# Patient Record
Sex: Male | Born: 1960 | Race: White | Hispanic: No | Marital: Married | Smoking: Light tobacco smoker
Health system: Southern US, Community
[De-identification: ages and names within clinical notes are randomized; demographics above are authoritative.]

## PROBLEM LIST (undated history)

## (undated) DIAGNOSIS — E785 Hyperlipidemia, unspecified: Secondary | ICD-10-CM

## (undated) DIAGNOSIS — Z460 Encounter for fitting and adjustment of spectacles and contact lenses: Secondary | ICD-10-CM

## (undated) DIAGNOSIS — IMO0002 Reserved for concepts with insufficient information to code with codable children: Secondary | ICD-10-CM

## (undated) DIAGNOSIS — T7840XA Allergy, unspecified, initial encounter: Secondary | ICD-10-CM

## (undated) DIAGNOSIS — I10 Essential (primary) hypertension: Secondary | ICD-10-CM

## (undated) HISTORY — PX: POLYPECTOMY: SHX149

## (undated) HISTORY — DX: Essential (primary) hypertension: I10

## (undated) HISTORY — PX: BACK SURGERY: SHX140

## (undated) HISTORY — DX: Hyperlipidemia, unspecified: E78.5

## (undated) HISTORY — PX: COLONOSCOPY: SHX174

## (undated) HISTORY — DX: Allergy, unspecified, initial encounter: T78.40XA

---
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
## 1999-03-10 ENCOUNTER — Ambulatory Visit (HOSPITAL_COMMUNITY): Admission: RE | Admit: 1999-03-10 | Discharge: 1999-03-10 | Payer: Self-pay | Admitting: Neurosurgery

## 1999-03-10 ENCOUNTER — Encounter: Payer: Self-pay | Admitting: Neurosurgery

## 1999-03-24 ENCOUNTER — Encounter: Payer: Self-pay | Admitting: Neurosurgery

## 1999-03-24 ENCOUNTER — Ambulatory Visit (HOSPITAL_COMMUNITY): Admission: RE | Admit: 1999-03-24 | Discharge: 1999-03-24 | Payer: Self-pay | Admitting: Neurosurgery

## 1999-04-07 ENCOUNTER — Encounter: Payer: Self-pay | Admitting: Neurosurgery

## 1999-04-07 ENCOUNTER — Ambulatory Visit (HOSPITAL_COMMUNITY): Admission: RE | Admit: 1999-04-07 | Discharge: 1999-04-07 | Payer: Self-pay | Admitting: Neurosurgery

## 2001-11-06 ENCOUNTER — Encounter: Admission: RE | Admit: 2001-11-06 | Discharge: 2001-11-06 | Payer: Self-pay | Admitting: Neurosurgery

## 2001-11-06 ENCOUNTER — Encounter: Payer: Self-pay | Admitting: Neurosurgery

## 2001-11-20 ENCOUNTER — Encounter: Admission: RE | Admit: 2001-11-20 | Discharge: 2001-11-20 | Payer: Self-pay | Admitting: Neurosurgery

## 2001-11-20 ENCOUNTER — Encounter: Payer: Self-pay | Admitting: Neurosurgery

## 2002-05-04 ENCOUNTER — Encounter: Payer: Self-pay | Admitting: Family Medicine

## 2002-05-04 ENCOUNTER — Encounter: Admission: RE | Admit: 2002-05-04 | Discharge: 2002-05-04 | Payer: Self-pay | Admitting: Family Medicine

## 2002-05-18 ENCOUNTER — Encounter: Admission: RE | Admit: 2002-05-18 | Discharge: 2002-05-18 | Payer: Self-pay | Admitting: Family Medicine

## 2002-05-18 ENCOUNTER — Encounter: Payer: Self-pay | Admitting: Family Medicine

## 2002-06-02 ENCOUNTER — Encounter: Admission: RE | Admit: 2002-06-02 | Discharge: 2002-06-02 | Payer: Self-pay | Admitting: Family Medicine

## 2002-06-02 ENCOUNTER — Encounter: Payer: Self-pay | Admitting: Family Medicine

## 2002-07-24 ENCOUNTER — Ambulatory Visit (HOSPITAL_COMMUNITY): Admission: RE | Admit: 2002-07-24 | Discharge: 2002-07-25 | Payer: Self-pay | Admitting: Neurosurgery

## 2002-07-24 ENCOUNTER — Encounter: Payer: Self-pay | Admitting: Neurosurgery

## 2009-03-11 ENCOUNTER — Encounter: Admission: RE | Admit: 2009-03-11 | Discharge: 2009-03-11 | Payer: Self-pay | Admitting: Internal Medicine

## 2009-03-16 ENCOUNTER — Ambulatory Visit (HOSPITAL_COMMUNITY): Admission: RE | Admit: 2009-03-16 | Discharge: 2009-03-17 | Payer: Self-pay | Admitting: Neurosurgery

## 2009-03-23 ENCOUNTER — Encounter: Admission: RE | Admit: 2009-03-23 | Discharge: 2009-03-23 | Payer: Self-pay | Admitting: Neurosurgery

## 2010-10-04 IMAGING — CR DG LUMBAR SPINE 1V
1 series · 1 of 1 positions shown · non-contrast
Comparison: Lumbar spine MRI 03/11/2009

CLINICAL DATA: Lumbar disc herniation.

LUMBAR SPINE - 1 VIEW

[view not recorded]
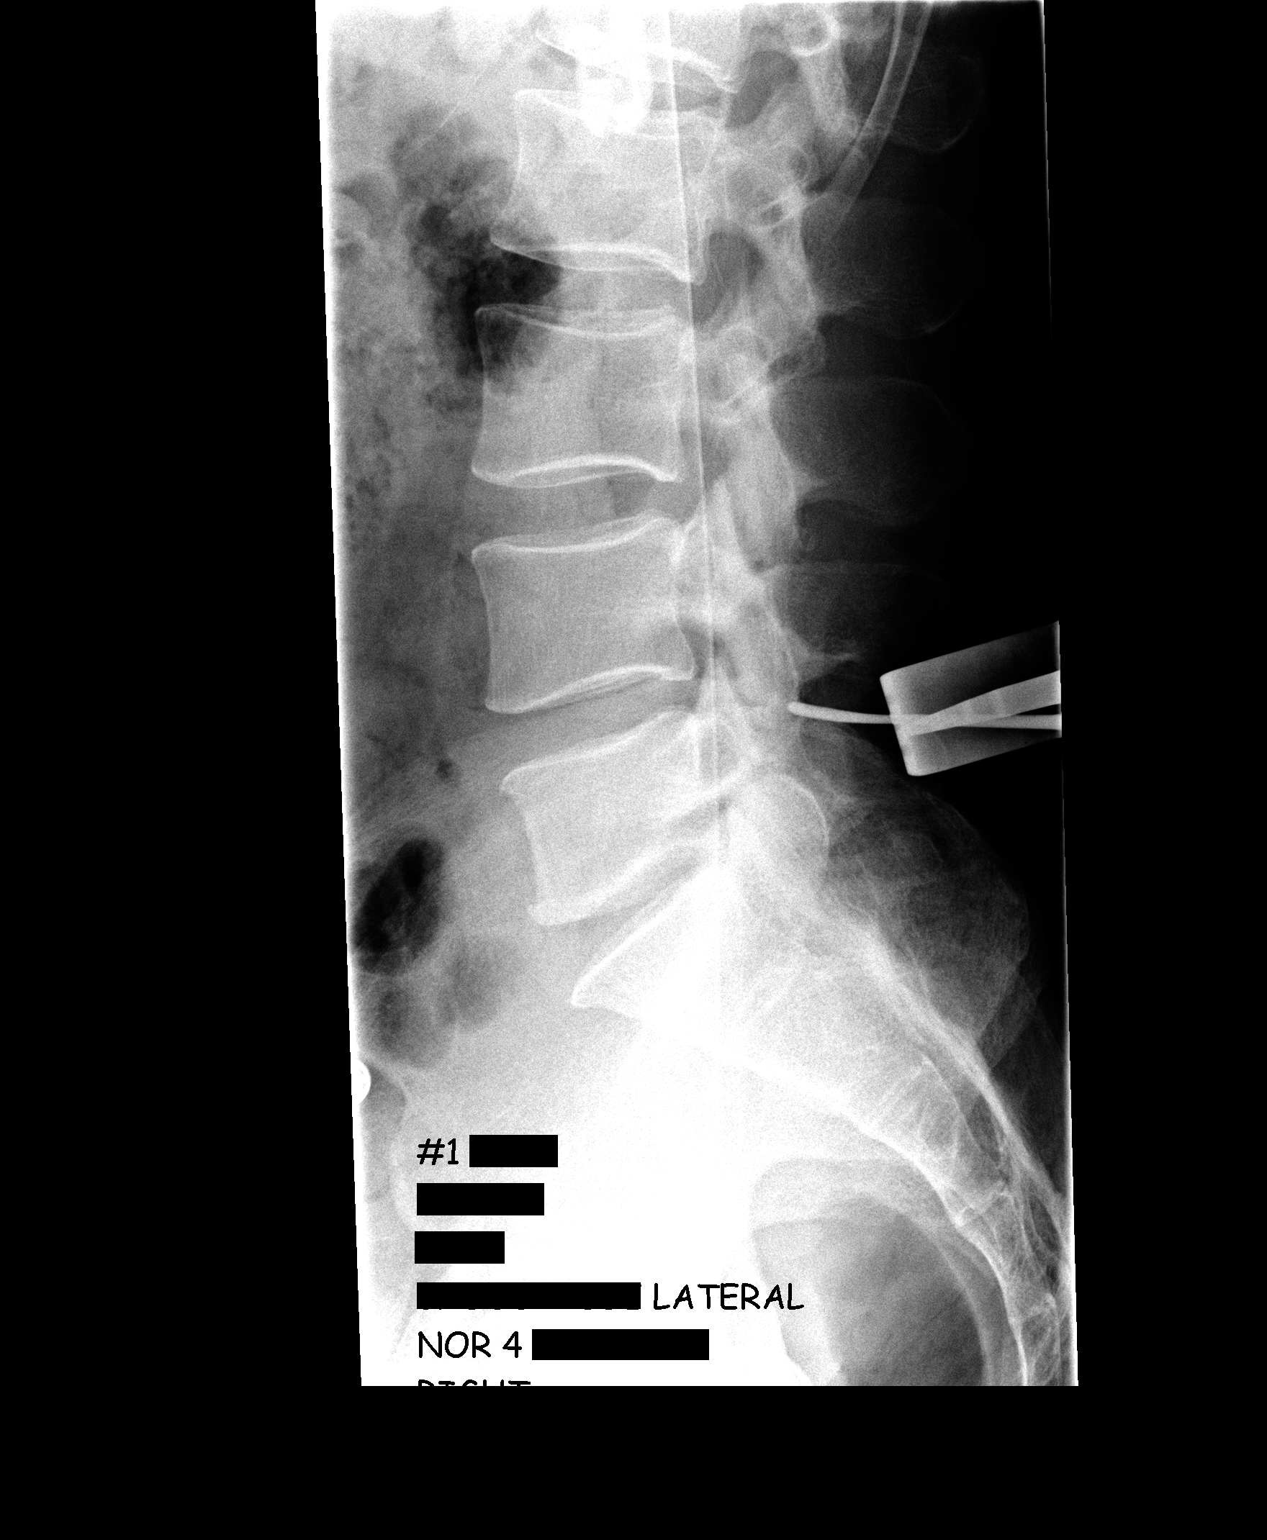

[1 of 1 positions shown; findings below may reference images not displayed]

FINDINGS: Lateral lumbar spine film from the operating room
demonstrates a surgical instrument marking the L4-5 disc space.
IMPRESSION: L4-5 marked intraoperatively.

## 2010-10-04 IMAGING — CR DG CHEST 2V
2 series · 2 of 2 positions shown · non-contrast
Comparison: None

CLINICAL DATA: Pre admit for lumbar disc herniation

CHEST - 2 VIEW

[view not recorded (1 of 2)]
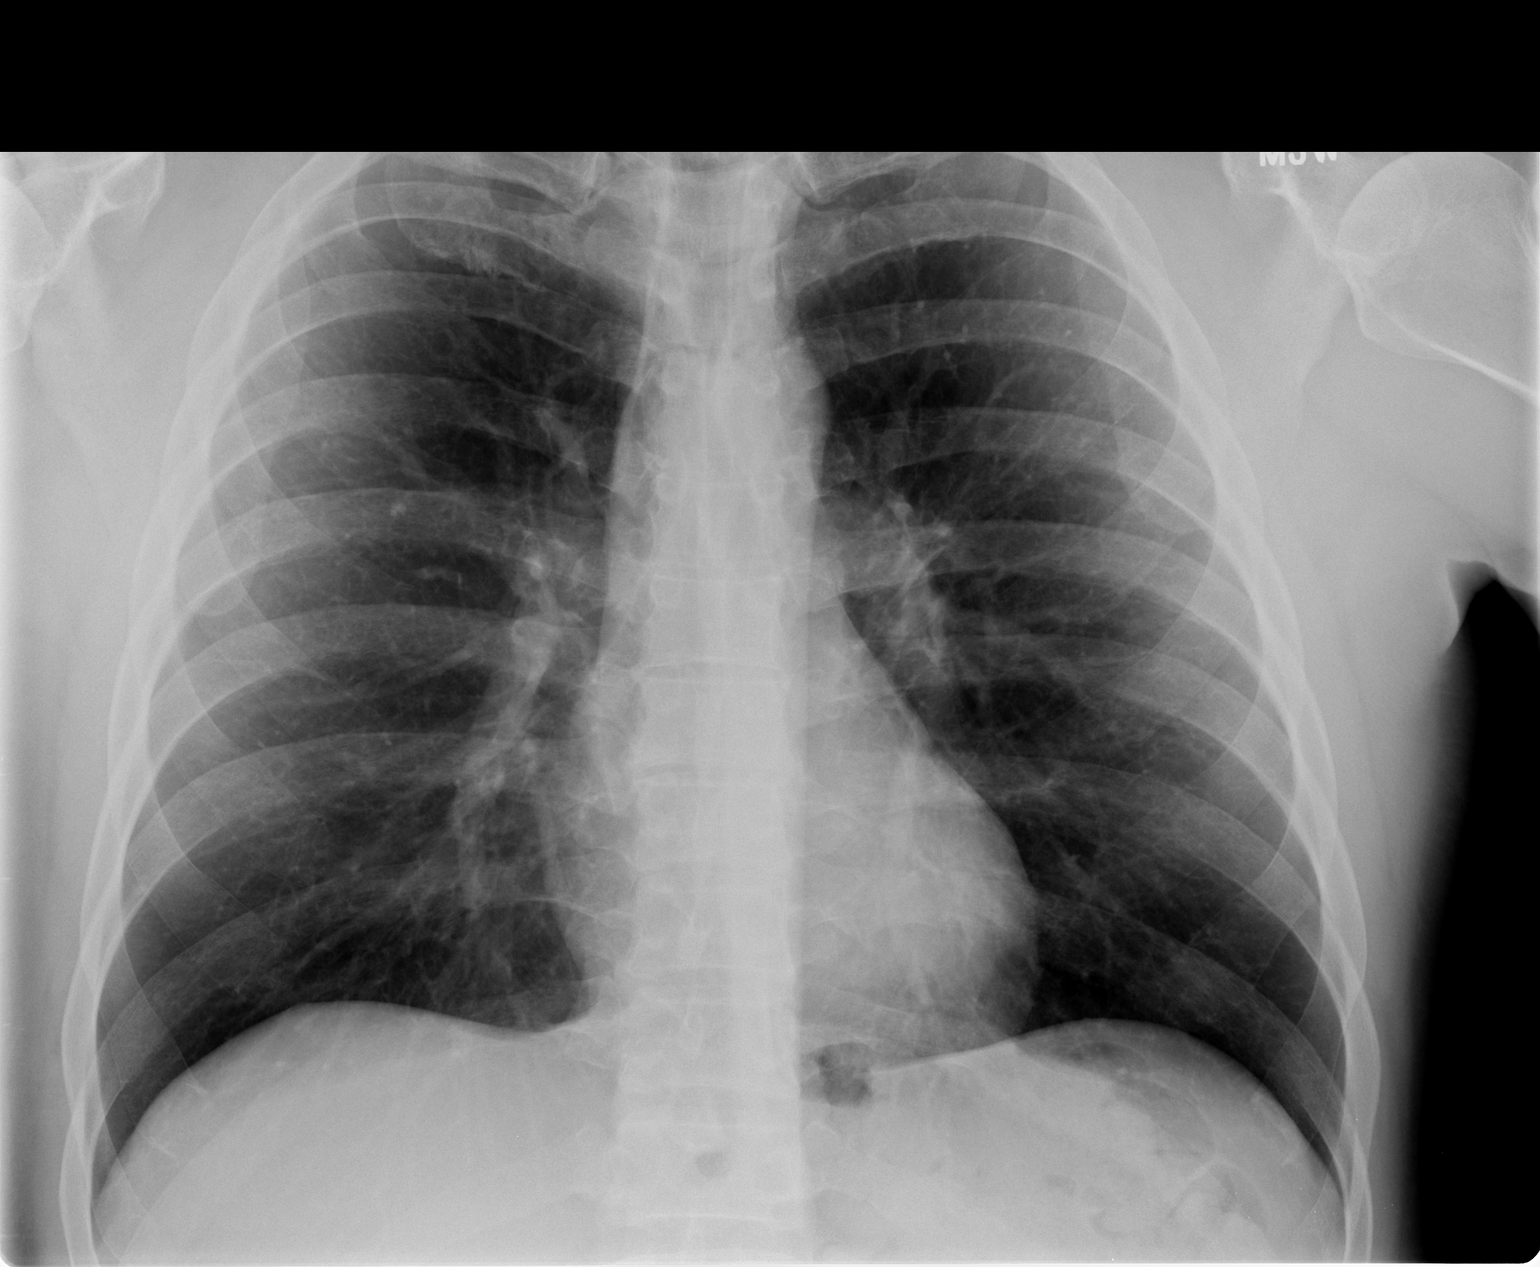

[view not recorded (2 of 2)]
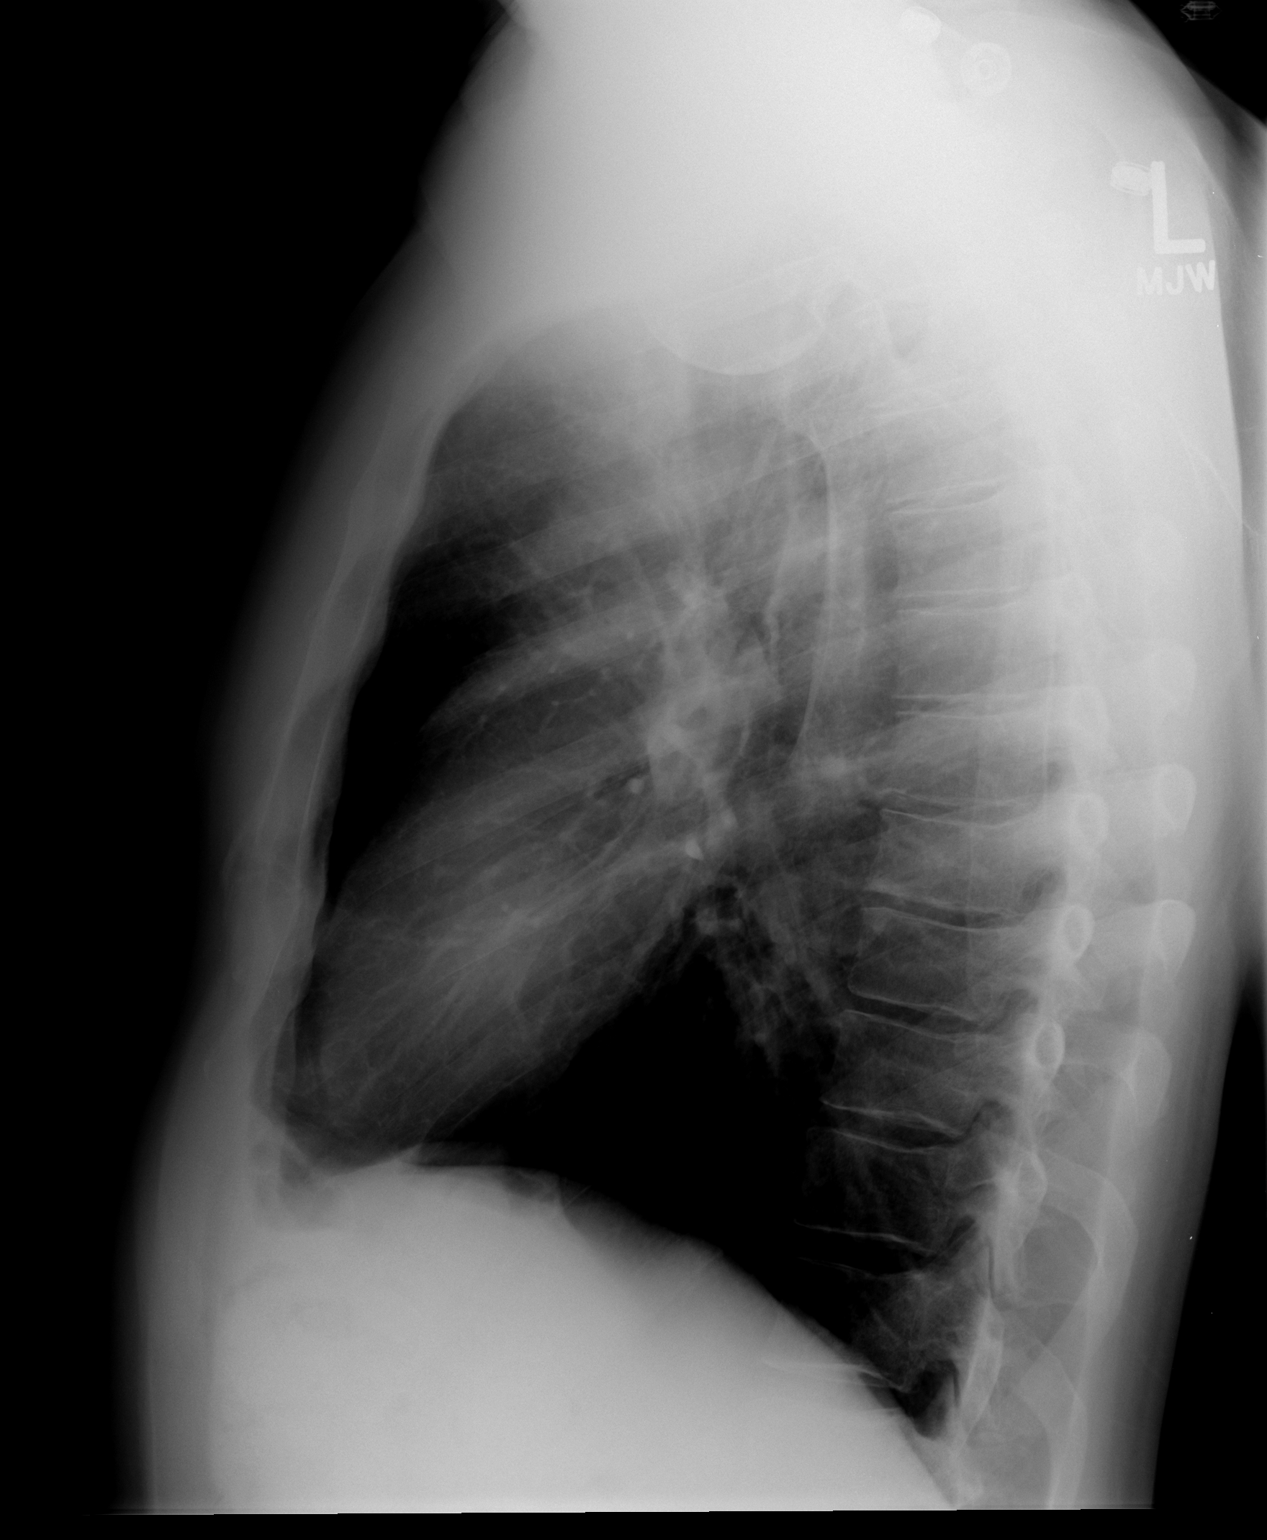

[2 of 2 positions shown; findings below may reference images not displayed]

FINDINGS: Heart and mediastinal contours normal.  There is a small
calcified granuloma in the right upper lobe.  The lungs are mildly
hyperaerated.  No active disease.  Osseous structures intact.
IMPRESSION: Chronic changes as above - no active disease.

## 2010-11-04 LAB — BASIC METABOLIC PANEL
BUN: 14 mg/dL (ref 6–23)
CO2: 30 mEq/L (ref 19–32)
Calcium: 9.2 mg/dL (ref 8.4–10.5)
Chloride: 103 mEq/L (ref 96–112)
Creatinine, Ser: 1.01 mg/dL (ref 0.4–1.5)
GFR calc Af Amer: >60 mL/min (ref >60)
GFR calc non Af Amer: >60 mL/min (ref >60)
Glucose, Bld: 106 mg/dL — ABNORMAL HIGH (ref 70–99)
Potassium: 4.2 mEq/L (ref 3.5–5.1)
Sodium: 139 mEq/L (ref 135–145)

## 2010-11-04 LAB — CBC
HCT: 44.6 % (ref 39.0–52.0)
Hemoglobin: 15.4 g/dL (ref 13.0–17.0)
MCHC: 34.5 g/dL (ref 30.0–36.0)
MCV: 95 fL (ref 78.0–100.0)
Platelets: 211 10*3/uL (ref 150–400)
RBC: 4.69 MIL/uL (ref 4.22–5.81)
RDW: 12.5 % (ref 11.5–15.5)
WBC: 6.1 10*3/uL (ref 4.0–10.5)

## 2010-12-12 NOTE — Op Note (Signed)
NAMEANGELL, HONSE                ACCOUNT NO.:  [id number]   MEDICAL RECORD NO.:  [id number]          PATIENT TYPE:  OIB   LOCATION:  3528                         FACILITY:  [facility]   PHYSICIAN:  Hewitt Shorts, M.D.DATE OF BIRTH:  [birth year]   DATE OF PROCEDURE:  03/16/2009  DATE OF DISCHARGE:                               OPERATIVE REPORT   PREOPERATIVE DIAGNOSES:  1. Left L5-S1 recurrent lumbar disk herniation.  2. Lumbar degenerative disk disease.  3. Lumbar spondylosis.  4. Lumbar radiculopathy.   POSTOPERATIVE DIAGNOSES:  1. Left L5-S1 recurrent lumbar disk herniation.  2. Lumbar degenerative disk disease.  3. Lumbar spondylosis.  4. Lumbar radiculopathy.   PROCEDURE:  Left L5-S1 lumbar laminotomy and microdiskectomy with  microdissection.   SURGEON:  Hewitt Shorts, MD.   ASSISTANT:  Dr. Lovell Sheehan.   ANESTHESIA:  General endotracheal.   INDICATIONS:  The patient is a 50 year old man who had a lumbar  diskectomy done about 8 years ago.  He did well up until about a month  ago when he developed recurrent pain.  He was found to have a large  recurrent disk herniation and a decision was made to proceed with  laminotomy and microdiskectomy.   PROCEDURE:  The patient was brought to the operating room and placed  under general endotracheal anesthesia.  The patient was turned to a  prone position.  Lumbar region was prepped with Betadine soap and  solution and draped in a sterile fashion.  The midline was infiltrated  with local anesthetic with epinephrine and an incision was made through  the previous midline incision and carried down through the subcutaneous  tissue.  Bipolar cautery and electrocautery was used to maintain  hemostasis.  Dissection was carried down to the lumbar fascia that was  incised on the left side of the midline.  The paraspinal muscles were  dissected from the spinous process and lamina in a subperiosteal  fashion.  An x-ray was taken and  then we identified the L5-S1  interlaminar space.  The operative microscope was draped and brought to  the field to provide additional navigation, illumination, and  visualization.  The remainder of the decompression was performed using  microdissection and microsurgical technique.  Care was taken in  dissecting and defining the margins of the previous laminotomy, and then  we extended the laminotomy rostrally and laterally until we had good  ligamentum flavum exposed.  We were able to dissect back towards the  previous exposure.  We were able to gradually mobilize the thecal sac  and nerve root.  Ligamentum flavum was carefully removed and scar tissue  was separated from the dura.  As this was done, we were able to mobilize  the fecal sac.  Immediately, we encountered  the free fragment of disk  herniation.  This was removed in several large fragments and we were  able to get good decompression of the thecal sac and nerve root.  We  then further dissected the epidural scar tissue and we were able to  mobilize the thecal sac and nerve root medially.  We  exposed the annulus  of the L5-S1 disk.  Overlying epidural veins were coagulated and divided  and then we examined the epidural space.  No further fragments were  found and the rent in the annulus through which a disk herniation had  occurred, could not be found and appears to have sealed back over.  Therefore, it was felt that we achieved good decompression of the nerve  root and thecal sac by removing the free fragment and that we did not  need to enter into the disk space.   We then irrigated the wound with Bacitracin solution.  Checked for  hemostasis, was established with use of bipolar cautery and then  instilled 2 mL of fentanyl and 80 mg of Depo-Medrol into the epidural  space and proceeded with closure.  The deep fascia was closed with  interrupted undyed #1 Vicryl sutures.  Scarpa fascia was closed with  interrupted undyed #1  Vicryl sutures.  The subcutaneous and subcuticular  were closed with interrupted inverted 2-0 and 3-0 undyed Vicryl sutures.  The skin was reapproximated with Dermabond.  The procedure was tolerated  well.  The estimated blood loss was 25 mL.  Sponge and needle counts  were correct.  Following surgery, the patient was returned back to the  supine position, to be reversed from the anesthetic, extubated, and  transferred to the recovery room for further care.      Hewitt Shorts, M.D.  Electronically Signed     RWN/MEDQ  D:  03/16/2009  T:  03/17/2009  Job:  [id number]

## 2010-12-15 NOTE — Op Note (Signed)
NAMESAED, HUDLOW                          ACCOUNT NO.:  [id number]   MEDICAL RECORD NO.:  [id number]                   PATIENT TYPE:  OIB   LOCATION:  3012                                 FACILITY:  [facility]   PHYSICIAN:  Coletta Memos, M.D.                  DATE OF BIRTH:  31-Oct-1960   DATE OF PROCEDURE:  07/24/2002  DATE OF DISCHARGE:                                 OPERATIVE REPORT   PREOPERATIVE DIAGNOSES:  1. Displaced disk at L5-S1 left.  2. Left S1 radiculopathy.   POSTOPERATIVE DIAGNOSES:  1. Displaced disk at L5-S1 left.  2. Lumbar radiculopathy.  3. Degenerative disk disease at L5-S1.   PROCEDURE:  Left L5-S1 diskectomy with microdissection.   COMPLICATIONS:  None.   SURGEON:  Coletta Memos, M.D.   ANESTHESIA:  General endotracheal.   INDICATIONS:  The patient is a patient whom I have seen since 2000.  I have  followed him for degenerative disk and displaced disk at L5-S1 during that  time period.  Epidural steroids have been able to deal with his pain until  recently.  He therefore was admitted for a diskectomy at L5-S1.   DESCRIPTION OF PROCEDURE:  The patient was brought to the operating room,  intubated, and placed under general anesthesia without difficulty.  He was  rolled prone onto a Wilson frame and all pressure points were properly  padded.  His back was prepped.  He was draped in a sterile fashion.  I  infiltrated 0.5% lidocaine and 1:200,000 strength epinephrine at the  proposed incision site after taking a localizing x-ray.  I then opened the  skin with a #10 blade and took this down to the thoracolumbar fascia.  I  exposed the laminae of L5 and S1 in a subperiosteal fashion.  I then removed  the ligamentum flavum between L5 and S1, exposing the thecal sac.  The  microscope was then brought into the operative field and with  microdissection, I then retracted the thecal sac and S1 nerve root medially  and performed a diskectomy after opening the  disk space with a #15 blade.  The disk was removed rather easily, as it is quite degenerated.  I used  Epstein curettes, pituitary rongeurs, and Kerrison punches to remove disk  material.  After I felt that adequate decompression had been performed, I  inspected the nerve root and did not appreciate any more disk material.  I  then irrigated the wound.  I then closed the wound in a layered fashion  using Vicryl sutures, reapproximating the thoracolumbar fascia, subcutaneous  tissues in succession.  Dermabond was used for a sterile dressing.  The  patient tolerated the procedure well without difficulty.  She was rolled  supine, extubated, and was moving all extremities.  Coletta Memos, M.D.   KC/MEDQ  D:  07/25/2002  T:  07/25/2002  Job:  [id number]

## 2012-01-24 ENCOUNTER — Encounter: Payer: Self-pay | Admitting: Gastroenterology

## 2012-02-25 ENCOUNTER — Encounter: Payer: Self-pay | Admitting: Gastroenterology

## 2012-02-25 ENCOUNTER — Ambulatory Visit (AMBULATORY_SURGERY_CENTER): Payer: Managed Care, Other (non HMO) | Admitting: *Deleted

## 2012-02-25 VITALS — Ht 71.0 in | Wt 214.8 lb

## 2012-02-25 DIAGNOSIS — Z1211 Encounter for screening for malignant neoplasm of colon: Secondary | ICD-10-CM

## 2012-02-25 MED ORDER — MOVIPREP 100 G PO SOLR: 1.0000 | Freq: Once | ORAL | Status: DC

## 2012-03-10 ENCOUNTER — Encounter: Payer: Self-pay | Admitting: Gastroenterology

## 2012-03-17 ENCOUNTER — Encounter: Payer: Self-pay | Admitting: Gastroenterology

## 2012-03-17 ENCOUNTER — Ambulatory Visit (AMBULATORY_SURGERY_CENTER): Payer: Managed Care, Other (non HMO) | Admitting: Gastroenterology

## 2012-03-17 VITALS — BP 111/78 | HR 62 | Temp 98.2°F | Resp 20 | Ht 71.0 in | Wt 214.0 lb

## 2012-03-17 DIAGNOSIS — D126 Benign neoplasm of colon, unspecified: Secondary | ICD-10-CM

## 2012-03-17 DIAGNOSIS — Z1211 Encounter for screening for malignant neoplasm of colon: Secondary | ICD-10-CM

## 2012-03-17 MED ORDER — SODIUM CHLORIDE 0.9 % IV SOLN: 500.0000 mL | INTRAVENOUS | Status: DC

## 2012-03-17 NOTE — Patient Instructions (Signed)
YOU HAD AN ENDOSCOPIC PROCEDURE TODAY AT THE [facility]: Refer to the procedure report that was given to you for any specific questions about what was found during the examination.  If the procedure report does not answer your questions, please call your gastroenterologist to clarify.  If you requested that your care partner not be given the details of your procedure findings, then the procedure report has been included in a sealed envelope for you to review at your convenience later.  YOU SHOULD EXPECT: Some feelings of bloating in the abdomen. Passage of more gas than usual.  Walking can help get rid of the air that was put into your GI tract during the procedure and reduce the bloating. If you had a lower endoscopy (such as a colonoscopy or flexible sigmoidoscopy) you may notice spotting of blood in your stool or on the toilet paper. If you underwent a bowel prep for your procedure, then you may not have a normal bowel movement for a few days.  DIET: Your first meal following the procedure should be a light meal and then it is ok to progress to your normal diet.  A half-sandwich or bowl of soup is an example of a good first meal.  Heavy or fried foods are harder to digest and may make you feel nauseous or bloated.  Likewise meals heavy in dairy and vegetables can cause extra gas to form and this can also increase the bloating.  Drink plenty of fluids but you should avoid alcoholic beverages for 24 hours.  ACTIVITY: Your care partner should take you home directly after the procedure.  You should plan to take it easy, moving slowly for the rest of the day.  You can resume normal activity the day after the procedure however you should NOT DRIVE or use heavy machinery for 24 hours (because of the sedation medicines used during the test).    SYMPTOMS TO REPORT IMMEDIATELY: A gastroenterologist can be reached at any hour.  During normal business hours, 8:30 AM to 5:00 PM Monday through Friday,  call [phone].  After hours and on weekends, please call the GI answering service at [phone] who will take a message and have the physician on call contact you.   Following lower endoscopy (colonoscopy or flexible sigmoidoscopy):  Excessive amounts of blood in the stool  Significant tenderness or worsening of abdominal pains  Swelling of the abdomen that is new, acute  Fever of 100F or higher    FOLLOW UP: If any biopsies were taken you will be contacted by phone or by letter within the next 1-3 weeks.  Call your gastroenterologist if you have not heard about the biopsies in 3 weeks.  Our staff will call the home number listed on your records the next business day following your procedure to check on you and address any questions or concerns that you may have at that time regarding the information given to you following your procedure. This is a courtesy call and so if there is no answer at the home number and we have not heard from you through the emergency physician on call, we will assume that you have returned to your regular daily activities without incident.  SIGNATURES/CONFIDENTIALITY: You and/or your care partner have signed paperwork which will be entered into your electronic medical record.  These signatures attest to the fact that that the information above on your After Visit Summary has been reviewed and is understood.  Full responsibility of the confidentiality   of this discharge information lies with you and/or your care-partner.     INFORMATION ON POLYPS GIVEN TO YOU TODAY  HOLD ASPIRIN & ANTI INFLAMMATORY MEDICATIONS FOR 2 WEEKS.  REPEAT COLONOSCOPY IN 1 YEAR   AWAIT PATHOLOGY REPORT

## 2012-03-17 NOTE — Op Note (Signed)
[facility] [address] [place], [address]   COLONOSCOPY PROCEDURE REPORT  PATIENT: Matthew Horne, Matthew Horne.  MR#: [id number] BIRTHDATE: 04/18/1961 , 51  yrs. old GENDER: Male ENDOSCOPIST: Mardella Layman, MD, [facility] REFERRED BY:  Tomi Bamberger, NP PROCEDURE DATE:  03/17/2012 PROCEDURE:   Colonoscopy with snare and Submucosal injection, any substance ASA CLASS:   Class II INDICATIONS:average risk patient for colon cancer. MEDICATIONS: propofol (Diprivan) 400 mg IV  DESCRIPTION OF PROCEDURE:   After the risks and benefits and of the procedure were explained, informed consent was obtained.  A digital rectal exam revealed no abnormalities of the rectum.   The LB CF-[id number]  endoscope was introduced through the anus and advanced to the cecum, which was identified by both the appendix and ileocecal valve .  The quality of the prep was excellent, using MoviPrep .  The instrument was then slowly withdrawn as the colon was fully examined.    FINDINGS:  COLON FINDINGS: A polypoid shaped and smooth sessile polyp measuring 18 mm in size with a mucous cap was found in the ascending colon. Polypectomy was performed using hot snare.  All resections were complete and all polyp tissue was completely retrieved.   A smooth sessile polyp ranging between 5-58mm in size with a mucous cap was found in the transverse colon.  Endoscopic mucosal resection was performed in a piecemeal fashion.  The resection was complete and the polyp tissue was completely retrieved.   Two flat polyps ranging between 5-30mm in size with mucous caps were found in the descending colon.  A polypectomy was performed using snare cautery. The resection was complete and the polyp tissue was completely retrieved.    Retroflexed views revealed no abnormalities.    The scope was then withdrawn from the patient and the procedure completed.  COMPLICATIONS: There were no complications.  ENDOSCOPIC  IMPRESSION: 1.   Sessile polyp measuring 18 mm in size was found in the ascending colon; Polypectomy was performed using hot snare 2.   Sessile polyp ranging between 5-49mm in size was found in the transverse colon; endoscopic mucosal resection was performed 3.   Two flat polyps ranging between 5-19mm in size were found in the descending colon; polypectomy was performed using snare cautery  RECOMMENDATIONS: 1.  Hold aspirin, aspirin products, and anti-inflammatory medication for 2 weeks. 2.  Await pathology results 3.  Repeat Colonoscopy in 1 year.   REPEAT EXAM:  standard discharge  _______________________________ eSigned:  Mardella Layman, MD, [facility] 03/17/2012 2:31 PM     PATIENT NAME:  [name] MR#: [id number]

## 2012-03-17 NOTE — Progress Notes (Signed)
Patient did not have preoperative order for IV antibiotic SSI prophylaxis. (G8918)  Patient did not experience any of the following events: a burn prior to discharge; a fall within the facility; wrong site/side/patient/procedure/implant event; or a hospital transfer or hospital admission upon discharge from the facility. (G8907)  

## 2012-03-17 NOTE — Progress Notes (Signed)
popofol per [facility] crna. See scanned intra procedure report. All meds titrated per crna during procedure. ewm

## 2012-03-18 ENCOUNTER — Telehealth: Payer: Self-pay

## 2012-03-18 NOTE — Telephone Encounter (Signed)
Left message

## 2012-03-21 ENCOUNTER — Encounter: Payer: Self-pay | Admitting: Gastroenterology

## 2012-03-26 ENCOUNTER — Encounter: Payer: Self-pay | Admitting: Gastroenterology

## 2012-07-30 LAB — HM COLONOSCOPY

## 2013-01-21 ENCOUNTER — Encounter: Payer: Self-pay | Admitting: Gastroenterology

## 2013-01-28 ENCOUNTER — Encounter (HOSPITAL_BASED_OUTPATIENT_CLINIC_OR_DEPARTMENT_OTHER): Payer: Self-pay | Admitting: *Deleted

## 2013-01-28 NOTE — Progress Notes (Signed)
To come in for ekg-bmet 

## 2013-01-28 NOTE — H&P (Signed)
Matthew Horne is an 52 y.o. male.   Chief Complaint: Left knee pain. HPI: Matthew Horne is 52 years old.  He has had a number of years of left knee pain.  He is having pain along the medial joint line with increased activity.  At this point he is in terrible discomfort.  He does have limited motion of his leg because of pain.  He is walking with a bad limp.  Trouble at nighttime sleeping comfortably.  He has been on Aleve as an anti-inflammatory medicine.  Recent MRI scan dated 01/27/13 shows a medial meniscus tear with some arthritic change.  Past Medical History  Diagnosis Date  . Hyperlipidemia   . Hypertension   . DDD (degenerative disc disease)   . Contact lens/glasses fitting     wears contacts or glasses    Past Surgical History  Procedure Laterality Date  . Tonsillectomy  1967  . Back surgery  2010, 2003    lumb lam/diskectomy  . Colonoscopy      Family History  Problem Relation Age of Onset  . Colon cancer Neg Hx   . Esophageal cancer Neg Hx   . Rectal cancer Neg Hx   . Stomach cancer Neg Hx    Social History:  reports that he has never smoked. He has never used smokeless tobacco. He reports that he drinks about 1.0 ounces of alcohol per week. He reports that he does not use illicit drugs.  Allergies: No Known Allergies  No prescriptions prior to admission    No results found for this or any previous visit (from the past 48 hour(s)). No results found.  Review of Systems  Musculoskeletal: Positive for joint pain.  All other systems reviewed and are negative.    Height 5\' 11"  (1.803 m), weight 97.523 kg (215 lb). Physical Exam  Constitutional: He is oriented to person, place, and time. He appears well-developed.  HENT:  Head: Normocephalic.  Eyes: Pupils are equal, round, and reactive to light.  Neck: Normal range of motion.  Cardiovascular: Normal rate.   Respiratory: Effort normal.  GI: Soft.  Musculoskeletal:  Left knee exam: Trace to 1+ effusion.  Range of  motion 10-90.  Terrible pain along the medial joint line to palpation.  normal neurovascular status otherwise.  Full hip and ankle motion.  Neurological: He is oriented to person, place, and time.  Skin: Skin is dry.  Psychiatric: He has a normal mood and affect.     Assessment/Plan Assessment: Left knee torn meniscus by MRI scan Plan: Matthew Horne is struggling with pain and inability to ambulate.  We have discussed proceeding with a knee arthroscopy on the left side to take care of his meniscus tear.  We have discussed the risks of anesthesia, infection and potential DVT related to knee arthroscopy.  He will also need postoperative therapy to optimize his results.  Matthew Horne R 01/28/2013, 6:25 PM

## 2013-01-29 ENCOUNTER — Encounter (HOSPITAL_BASED_OUTPATIENT_CLINIC_OR_DEPARTMENT_OTHER)
Admission: RE | Admit: 2013-01-29 | Discharge: 2013-01-29 | Disposition: A | Discharge status: Home / Self Care | Payer: Managed Care, Other (non HMO) | Source: Ambulatory Visit | Attending: Orthopaedic Surgery | Admitting: Orthopaedic Surgery

## 2013-01-29 LAB — BASIC METABOLIC PANEL
BUN: 17 mg/dL (ref 6–23)
Calcium: 9 mg/dL (ref 8.4–10.5)
Chloride: 105 mEq/L (ref 96–112)
Creatinine, Ser: 0.99 mg/dL (ref 0.50–1.35)
GFR calc Af Amer: >90 mL/min (ref >90)

## 2013-02-02 ENCOUNTER — Other Ambulatory Visit: Payer: Self-pay | Admitting: Orthopaedic Surgery

## 2013-02-03 ENCOUNTER — Encounter (HOSPITAL_BASED_OUTPATIENT_CLINIC_OR_DEPARTMENT_OTHER): Payer: Self-pay

## 2013-02-03 ENCOUNTER — Encounter (HOSPITAL_BASED_OUTPATIENT_CLINIC_OR_DEPARTMENT_OTHER)
Admission: RE | Disposition: A | Discharge status: Home / Self Care | Payer: Self-pay | Source: Ambulatory Visit | Attending: Orthopaedic Surgery

## 2013-02-03 ENCOUNTER — Ambulatory Visit (HOSPITAL_BASED_OUTPATIENT_CLINIC_OR_DEPARTMENT_OTHER): Payer: Managed Care, Other (non HMO) | Admitting: Anesthesiology

## 2013-02-03 ENCOUNTER — Ambulatory Visit (HOSPITAL_BASED_OUTPATIENT_CLINIC_OR_DEPARTMENT_OTHER)
Admission: RE | Admit: 2013-02-03 | Discharge: 2013-02-03 | Disposition: A | Discharge status: Home / Self Care | Payer: Managed Care, Other (non HMO) | Source: Ambulatory Visit | Attending: Orthopaedic Surgery | Admitting: Orthopaedic Surgery

## 2013-02-03 ENCOUNTER — Encounter (HOSPITAL_BASED_OUTPATIENT_CLINIC_OR_DEPARTMENT_OTHER): Payer: Self-pay | Admitting: Anesthesiology

## 2013-02-03 DIAGNOSIS — M2240 Chondromalacia patellae, unspecified knee: Secondary | ICD-10-CM | POA: Insufficient documentation

## 2013-02-03 DIAGNOSIS — S83242A Other tear of medial meniscus, current injury, left knee, initial encounter: Secondary | ICD-10-CM

## 2013-02-03 DIAGNOSIS — IMO0002 Reserved for concepts with insufficient information to code with codable children: Secondary | ICD-10-CM | POA: Insufficient documentation

## 2013-02-03 DIAGNOSIS — M23302 Other meniscus derangements, unspecified lateral meniscus, unspecified knee: Secondary | ICD-10-CM | POA: Insufficient documentation

## 2013-02-03 DIAGNOSIS — I10 Essential (primary) hypertension: Secondary | ICD-10-CM | POA: Insufficient documentation

## 2013-02-03 DIAGNOSIS — E785 Hyperlipidemia, unspecified: Secondary | ICD-10-CM | POA: Insufficient documentation

## 2013-02-03 DIAGNOSIS — M23305 Other meniscus derangements, unspecified medial meniscus, unspecified knee: Secondary | ICD-10-CM | POA: Insufficient documentation

## 2013-02-03 HISTORY — DX: Encounter for fitting and adjustment of spectacles and contact lenses: Z46.0

## 2013-02-03 HISTORY — PX: KNEE ARTHROSCOPY: SHX127

## 2013-02-03 HISTORY — DX: Reserved for concepts with insufficient information to code with codable children: IMO0002

## 2013-02-03 SURGERY — ARTHROSCOPY, KNEE
Anesthesia: General | Site: Knee | Laterality: Left | Wound class: Clean

## 2013-02-03 MED ORDER — HYDROMORPHONE HCL PF 1 MG/ML IJ SOLN: 0.2500 mg | INTRAMUSCULAR | Status: DC | PRN

## 2013-02-03 MED ORDER — OXYCODONE HCL 5 MG/5ML PO SOLN: 5.0000 mg | Freq: Once | ORAL | Status: DC | PRN

## 2013-02-03 MED ORDER — CHLORHEXIDINE GLUCONATE 4 % EX LIQD: 60.0000 mL | Freq: Once | CUTANEOUS | Status: DC

## 2013-02-03 MED ORDER — MORPHINE SULFATE 4 MG/ML IJ SOLN
INTRAMUSCULAR | Status: DC | PRN
  Administered 2013-02-03: 16:00:00 via INTRA_ARTICULAR

## 2013-02-03 MED ORDER — PROPOFOL 10 MG/ML IV BOLUS
INTRAVENOUS | Status: DC | PRN
  Administered 2013-02-03: 250 mg via INTRAVENOUS

## 2013-02-03 MED ORDER — LACTATED RINGERS IV SOLN: INTRAVENOUS | Status: DC

## 2013-02-03 MED ORDER — LACTATED RINGERS IV SOLN
INTRAVENOUS | Status: DC
  Administered 2013-02-03: 14:00:00 via INTRAVENOUS

## 2013-02-03 MED ORDER — METOCLOPRAMIDE HCL 5 MG/ML IJ SOLN: 10.0000 mg | Freq: Once | INTRAMUSCULAR | Status: DC | PRN

## 2013-02-03 MED ORDER — CEFAZOLIN SODIUM-DEXTROSE 2-3 GM-% IV SOLR
2.0000 g | INTRAVENOUS | Status: AC
  Administered 2013-02-03: 2 g via INTRAVENOUS

## 2013-02-03 MED ORDER — MIDAZOLAM HCL 2 MG/2ML IJ SOLN: 1.0000 mg | INTRAMUSCULAR | Status: DC | PRN

## 2013-02-03 MED ORDER — MIDAZOLAM HCL 5 MG/5ML IJ SOLN
INTRAMUSCULAR | Status: DC | PRN
  Administered 2013-02-03: 2 mg via INTRAVENOUS

## 2013-02-03 MED ORDER — OXYCODONE HCL 5 MG PO TABS: 5.0000 mg | ORAL_TABLET | Freq: Once | ORAL | Status: DC | PRN

## 2013-02-03 MED ORDER — LIDOCAINE HCL (CARDIAC) 20 MG/ML IV SOLN
INTRAVENOUS | Status: DC | PRN
  Administered 2013-02-03: 80 mg via INTRAVENOUS

## 2013-02-03 MED ORDER — FENTANYL CITRATE 0.05 MG/ML IJ SOLN
INTRAMUSCULAR | Status: DC | PRN
  Administered 2013-02-03: 100 ug via INTRAVENOUS

## 2013-02-03 MED ORDER — ONDANSETRON HCL 4 MG/2ML IJ SOLN
INTRAMUSCULAR | Status: DC | PRN
  Administered 2013-02-03: 4 mg via INTRAVENOUS

## 2013-02-03 MED ORDER — FENTANYL CITRATE 0.05 MG/ML IJ SOLN: 50.0000 ug | INTRAMUSCULAR | Status: DC | PRN

## 2013-02-03 MED ORDER — DEXAMETHASONE SODIUM PHOSPHATE 4 MG/ML IJ SOLN
INTRAMUSCULAR | Status: DC | PRN
  Administered 2013-02-03: 10 mg via INTRAVENOUS

## 2013-02-03 MED ORDER — OXYCODONE-ACETAMINOPHEN 5-325 MG PO TABS: 1.0000 | ORAL_TABLET | ORAL | Status: DC | PRN

## 2013-02-03 MED ORDER — KETOROLAC TROMETHAMINE 30 MG/ML IJ SOLN
INTRAMUSCULAR | Status: DC | PRN
  Administered 2013-02-03: 30 mg via INTRAVENOUS

## 2013-02-03 MED ORDER — SODIUM CHLORIDE 0.9 % IR SOLN
Status: DC | PRN
  Administered 2013-02-03: 6000 mL

## 2013-02-03 SURGICAL SUPPLY — 42 items
BANDAGE ELASTIC 6 VELCRO ST LF (GAUZE/BANDAGES/DRESSINGS) ×2 IMPLANT
BANDAGE GAUZE ELAST BULKY 4 IN (GAUZE/BANDAGES/DRESSINGS) ×2 IMPLANT
BLADE CUDA 5.5 (BLADE) IMPLANT
BLADE GREAT WHITE 4.2 (BLADE) ×2 IMPLANT
CANISTER OMNI JUG 16 LITER (MISCELLANEOUS) IMPLANT
CANISTER SUCTION 2500CC (MISCELLANEOUS) IMPLANT
DRAPE ARTHROSCOPY W/POUCH 114 (DRAPES) ×2 IMPLANT
DRAPE U-SHAPE 47X51 STRL (DRAPES) ×2 IMPLANT
DRSG EMULSION OIL 3X3 NADH (GAUZE/BANDAGES/DRESSINGS) ×2 IMPLANT
DURAPREP 26ML APPLICATOR (WOUND CARE) ×2 IMPLANT
ELECT MENISCUS 165MM 90D (ELECTRODE) IMPLANT
ELECT REM PT RETURN 9FT ADLT (ELECTROSURGICAL)
ELECTRODE REM PT RTRN 9FT ADLT (ELECTROSURGICAL) IMPLANT
GLOVE BIO SURGEON STRL SZ 6.5 (GLOVE) ×2 IMPLANT
GLOVE BIO SURGEON STRL SZ8.5 (GLOVE) ×2 IMPLANT
GLOVE BIOGEL PI IND STRL 7.0 (GLOVE) ×1 IMPLANT
GLOVE BIOGEL PI IND STRL 8 (GLOVE) ×1 IMPLANT
GLOVE BIOGEL PI IND STRL 8.5 (GLOVE) ×1 IMPLANT
GLOVE BIOGEL PI INDICATOR 7.0 (GLOVE) ×1
GLOVE BIOGEL PI INDICATOR 8 (GLOVE) ×1
GLOVE BIOGEL PI INDICATOR 8.5 (GLOVE) ×1
GLOVE EXAM NITRILE PF MED BLUE (GLOVE) ×4 IMPLANT
GLOVE SS BIOGEL STRL SZ 8 (GLOVE) ×1 IMPLANT
GLOVE SUPERSENSE BIOGEL SZ 8 (GLOVE) ×1
GOWN PREVENTION PLUS XLARGE (GOWN DISPOSABLE) ×2 IMPLANT
GOWN PREVENTION PLUS XXLARGE (GOWN DISPOSABLE) ×2 IMPLANT
GOWN STRL REIN 2XL XLG LVL4 (GOWN DISPOSABLE) ×2 IMPLANT
IV NS IRRIG 3000ML ARTHROMATIC (IV SOLUTION) ×4 IMPLANT
KNEE WRAP E Z 3 GEL PACK (MISCELLANEOUS) ×2 IMPLANT
PACK ARTHROSCOPY DSU (CUSTOM PROCEDURE TRAY) ×2 IMPLANT
PACK BASIN DAY SURGERY FS (CUSTOM PROCEDURE TRAY) ×2 IMPLANT
PENCIL BUTTON HOLSTER BLD 10FT (ELECTRODE) IMPLANT
SET ARTHROSCOPY TUBING (MISCELLANEOUS) ×1
SET ARTHROSCOPY TUBING LN (MISCELLANEOUS) ×1 IMPLANT
SHEET MEDIUM DRAPE 40X70 STRL (DRAPES) ×2 IMPLANT
SPONGE GAUZE 4X4 12PLY (GAUZE/BANDAGES/DRESSINGS) ×2 IMPLANT
SYR 3ML 18GX1 1/2 (SYRINGE) IMPLANT
TOWEL OR 17X24 6PK STRL BLUE (TOWEL DISPOSABLE) ×2 IMPLANT
TOWEL OR NON WOVEN STRL DISP B (DISPOSABLE) ×2 IMPLANT
WAND 30 DEG SABER W/CORD (SURGICAL WAND) IMPLANT
WAND STAR VAC 90 (SURGICAL WAND) IMPLANT
WATER STERILE IRR 1000ML POUR (IV SOLUTION) ×2 IMPLANT

## 2013-02-03 NOTE — Interval H&P Note (Signed)
History and Physical Interval Note:  02/03/2013 3:30 PM  Matthew Horne  has presented today for surgery, with the diagnosis of left medial menical tear and chondramalasia  The various methods of treatment have been discussed with the patient and family. After consideration of risks, benefits and other options for treatment, the patient has consented to  Procedure(s): LEFT KNEE ARTHROSCOPY (Left) as a surgical intervention .  The patient's history has been reviewed, patient examined, no change in status, stable for surgery.  I have reviewed the patient's chart and labs.  Questions were answered to the patient's satisfaction.     Orell Hurtado G

## 2013-02-03 NOTE — Anesthesia Preprocedure Evaluation (Signed)
Anesthesia Evaluation  Patient identified by MRN, date of birth, ID band Patient awake    Reviewed: Allergy & Precautions, H&P , NPO status , Patient's Chart, lab work & pertinent test results, reviewed documented beta blocker date and time   Airway Mallampati: II TM Distance: >3 FB Neck ROM: full    Dental   Pulmonary neg pulmonary ROS,  breath sounds clear to auscultation        Cardiovascular hypertension, Rhythm:regular     Neuro/Psych negative neurological ROS  negative psych ROS   GI/Hepatic negative GI ROS, Neg liver ROS,   Endo/Other  negative endocrine ROS  Renal/GU negative Renal ROS  negative genitourinary   Musculoskeletal   Abdominal   Peds  Hematology negative hematology ROS (+)   Anesthesia Other Findings See surgeon's H&P   Reproductive/Obstetrics negative OB ROS                           Anesthesia Physical Anesthesia Plan  ASA: II  Anesthesia Plan: General   Post-op Pain Management:    Induction: Intravenous  Airway Management Planned: LMA  Additional Equipment:   Intra-op Plan:   Post-operative Plan:   Informed Consent: I have reviewed the patients History and Physical, chart, labs and discussed the procedure including the risks, benefits and alternatives for the proposed anesthesia with the patient or authorized representative who has indicated his/her understanding and acceptance.   Dental Advisory Given  Plan Discussed with: CRNA and Surgeon  Anesthesia Plan Comments:         Anesthesia Quick Evaluation

## 2013-02-03 NOTE — Transfer of Care (Signed)
Immediate Anesthesia Transfer of Care Note  Patient: Matthew Horne  Procedure(s) Performed: Procedure(s): LEFT KNEE ARTHROSCOPY, PARTIAL MEDIAL MENISCECTOMY, CHONDROPLASTY (Left)  Patient Location: PACU  Anesthesia Type:General  Level of Consciousness: sedated  Airway & Oxygen Therapy: Patient Spontanous Breathing and Patient connected to face mask oxygen  Post-op Assessment: Report given to PACU RN and Post -op Vital signs reviewed and stable  Post vital signs: Reviewed and stable  Complications: No apparent anesthesia complications

## 2013-02-03 NOTE — Op Note (Signed)
#  [id number] 

## 2013-02-03 NOTE — Discharge Instructions (Signed)
° ° ° ° ° ° °Arthroscopic Knee Surgery Post -OP Instructions ° °You have just had an arthroscopic operation. Your incisions (puncture sites) are small and should heal quickly. The structures inside your knee may take 6-8 weeks to heal and settle down. This healing time is variable and may range from a few days to 6-8 weeks. ° °Pain Medication: °You will be given a prescription for pain medication. Please take the medication as needed. Most patients require pain medication for only a few days. ° °Swelling: °You can expect some swelling in your knee. Applying ice and elevating your leg will help keep the swelling to a minimum. Ice can be applied by placing ice cubes in a plastic bag and putting the bag on your knee with a towel between the ice bag and your knee. Sometimes you will be issued an ice pack wrap and that will work as well. Your entire leg should be elevated, not just your knee. Elevate your leg to or above the level of your heart. The ice should be used periodically during the first 48 hours along with continued elevation of your leg. The swelling should subside over the next several weeks. ° °Dressing: °Fluid leakage is common the first night. Precautions to prevent staining of your clothes, bed sheets, etc., should be taken. This fluid was used to inflate the joint during surgery and is commonly tinged red from a small amount of blood. Remove your dressing tomorrow. Some bleeding or leakage from the puncture sites may occur for a few days and you should cover the puncture sites with Band-Aids until the leakage stops.Alternatively you may reapply the ace wrap with some gauze pads over the puncture sites but don't wrap it too tightly.  You may shower 2 days after surgery.  ° °Activity: °You may bend and straighten your knee as soon as it is comfortable to do so. Using your knee will help decrease swelling and help prevent stiffness, as long as you don’t overdo it. Moving your foot up and down also helps  decrease the swelling. There is no harm in putting weight on; your leg as long as it is comfortable to do so. (You should not, try to run or jump). Gradually increase activity as you can tolerate. An increase in pain or swelling with certain activities or with increased activities may indicate you are doing too much. Back up and start building up your activities at a slower rate. You may need crutches for a few days. ° °Office Check-Up: °If no major problems arise and you are progressing well, we will need to see you in the office in one (1) week unless otherwise instructed by your doctor. Please call the office to make an appointment.  ° ° ° °Post Anesthesia Home Care Instructions ° °Activity: °Get plenty of rest for the remainder of the day. A responsible adult should stay with you for 24 hours following the procedure.  °For the next 24 hours, DO NOT: °-Drive a car °-Operate machinery °-Drink alcoholic beverages °-Take any medication unless instructed by your physician °-Make any legal decisions or sign important papers. ° °Meals: °Start with liquid foods such as gelatin or soup. Progress to regular foods as tolerated. Avoid greasy, spicy, heavy foods. If nausea and/or vomiting occur, drink only clear liquids until the nausea and/or vomiting subsides. Call your physician if vomiting continues. ° °Special Instructions/Symptoms: °Your throat may feel dry or sore from the anesthesia or the breathing tube placed in your throat during surgery.   If this causes discomfort, gargle with warm salt water. The discomfort should disappear within 24 hours. ° °

## 2013-02-03 NOTE — Anesthesia Procedure Notes (Signed)
Procedure Name: LMA Insertion Performed by: Osker Ayoub W Pre-anesthesia Checklist: Patient identified, Timeout performed, Emergency Drugs available, Suction available and Patient being monitored Patient Re-evaluated:Patient Re-evaluated prior to inductionOxygen Delivery Method: Circle system utilized Preoxygenation: Pre-oxygenation with 100% oxygen Intubation Type: IV induction Ventilation: Mask ventilation without difficulty LMA: LMA inserted LMA Size: 5.0 Number of attempts: 1 Placement Confirmation: breath sounds checked- equal and bilateral and positive ETCO2 Tube secured with: Tape Dental Injury: Teeth and Oropharynx as per pre-operative assessment      

## 2013-02-04 ENCOUNTER — Encounter (HOSPITAL_BASED_OUTPATIENT_CLINIC_OR_DEPARTMENT_OTHER): Payer: Self-pay | Admitting: Orthopaedic Surgery

## 2013-02-04 NOTE — Anesthesia Postprocedure Evaluation (Signed)
Anesthesia Post Note  Patient: Matthew Horne  Procedure(s) Performed: Procedure(s) (LRB): LEFT KNEE ARTHROSCOPY, PARTIAL MEDIAL MENISCECTOMY, CHONDROPLASTY (Left)  Anesthesia type: General  Patient location: PACU  Post pain: Pain level controlled  Post assessment: Patient's Cardiovascular Status Stable  Last Vitals:  Filed Vitals:   02/03/13 1715  BP: 139/76  Pulse: 62  Temp: 36.3 C  Resp: 16    Post vital signs: Reviewed and stable  Level of consciousness: alert  Complications: No apparent anesthesia complications

## 2013-02-04 NOTE — Op Note (Signed)
Matthew Horne, Matthew Horne                ACCOUNT NO.:  [id number]  MEDICAL RECORD NO.:  [id number]  LOCATION:                               FACILITY:  [facility]  PHYSICIAN:  Lubertha Basque. Macaila Tahir, M.D.DATE OF BIRTH:  05/07/1961  DATE OF PROCEDURE:  02/03/2013 DATE OF DISCHARGE:  02/03/2013                              OPERATIVE REPORT   PREOPERATIVE DIAGNOSES: 1. Left knee torn medial meniscus. 2. Left knee chondromalacia.  POSTOPERATIVE DIAGNOSES: 1. Left knee torn medial meniscus. 2. Left knee chondromalacia.  PROCEDURE: 1. Left knee partial meniscectomy. 2. Abrasion chondroplasty, patellofemoral.  ANESTHESIA:  General.  ATTENDING SURGEON:  Lubertha Basque. Jerl Santos, MD  ASSISTANT:  Lindwood Qua, PA  INDICATION FOR PROCEDURE:  The patient is a 52 year old man with several years of left knee pain which has become much worse over recent months. He has undergone an MRI scan, which shows a medial meniscus tear.  He has pain which limits his ability to rest and walk and he has failed some conservative measures of management.  He is offered an arthroscopy. Informed operative consent was obtained after discussion of possible complications including reaction to anesthesia and infection.  SUMMARY OF FINDINGS AND PROCEDURE:  Under general anesthesia, an arthroscopy of the left knee was performed.  Suprapatellar pouch was benign while the patellofemoral joint exhibited focal breakdown at the apex of the patella, addressed with chondroplasty to bleeding bone over the tiny area.  The patella tracked fairly well.  The medial compartment showed a degenerative tear of the posterior middle horn, addressed about 15% partial medial meniscectomy.  He had some mild degenerative changes here.  ACL was normal and the lateral compartment had a tiny tear addressed with a brief partial lateral meniscectomy.  There were no degenerative changes in the lateral compartment.  He was discharged home the same day to  follow up in less than a week.  DESCRIPTION OF PROCEDURE:  The patient was taken to the operating suite where general anesthetic was applied without difficulty.  He was positioned supine and prepped and draped in normal sterile fashion. After the administration of preop IV Kefzol, an appropriate time-out and arthroscopy of left knee was performed through total of 2 portals. Findings were as noted above and procedure consisted predominantly of the partial medial meniscectomy done with the basket and shaver removing about 15% of the structure back to a stable rim.  Also performed a tiny partial lateral meniscectomy and the abrasion chondroplasty patellofemoral.  The knee was thoroughly irrigated followed by placement of Marcaine with epinephrine and morphine.  Adaptic was placed over the portals followed by dry gauze and loose Ace wrap.  ESTIMATED BLOOD LOSS AND FLUIDS:  Obtained from anesthesia records.  DISPOSITION:  The patient was extubated in the operating room and taken to recovery in stable addition.  He was to go home same-day and follow up in the office closely.  I will contact him by phone tonight.     Lubertha Basque Jerl Santos, M.D.     PGD/MEDQ  D:  02/03/2013  T:  02/04/2013  Job:  [id number]

## 2013-04-09 ENCOUNTER — Encounter: Payer: Self-pay | Admitting: Gastroenterology

## 2013-04-27 ENCOUNTER — Ambulatory Visit (AMBULATORY_SURGERY_CENTER): Payer: Self-pay | Admitting: *Deleted

## 2013-04-27 VITALS — Ht 71.0 in | Wt 226.0 lb

## 2013-04-27 DIAGNOSIS — Z86010 Personal history of colonic polyps: Secondary | ICD-10-CM

## 2013-04-27 MED ORDER — MOVIPREP 100 G PO SOLR: ORAL | Status: DC

## 2013-04-27 NOTE — Progress Notes (Signed)
Patient denies any allergies to eggs or soy. Patient denies any problems with anesthesia.  

## 2013-04-28 ENCOUNTER — Encounter: Payer: Self-pay | Admitting: Gastroenterology

## 2013-05-13 ENCOUNTER — Encounter: Payer: Managed Care, Other (non HMO) | Admitting: Gastroenterology

## 2013-05-15 ENCOUNTER — Ambulatory Visit (AMBULATORY_SURGERY_CENTER): Payer: Managed Care, Other (non HMO) | Admitting: Gastroenterology

## 2013-05-15 ENCOUNTER — Encounter: Payer: Self-pay | Admitting: Gastroenterology

## 2013-05-15 VITALS — BP 126/58 | HR 59 | Temp 97.8°F | Resp 17 | Ht 71.0 in | Wt 226.0 lb

## 2013-05-15 DIAGNOSIS — D126 Benign neoplasm of colon, unspecified: Secondary | ICD-10-CM

## 2013-05-15 DIAGNOSIS — Z86010 Personal history of colonic polyps: Secondary | ICD-10-CM

## 2013-05-15 MED ORDER — SODIUM CHLORIDE 0.9 % IV SOLN: 500.0000 mL | INTRAVENOUS | Status: DC

## 2013-05-15 NOTE — Patient Instructions (Signed)
Impressions/recommendations:  Normal colon  Residual fold of polyp removed and cauterized.  Followup colonoscopy pending pathology results.  YOU HAD AN ENDOSCOPIC PROCEDURE TODAY AT THE  ENDOSCOPY CENTER: Refer to the procedure report that was given to you for any specific questions about what was found during the examination.  If the procedure report does not answer your questions, please call your gastroenterologist to clarify.  If you requested that your care partner not be given the details of your procedure findings, then the procedure report has been included in a sealed envelope for you to review at your convenience later.  YOU SHOULD EXPECT: Some feelings of bloating in the abdomen. Passage of more gas than usual.  Walking can help get rid of the air that was put into your GI tract during the procedure and reduce the bloating. If you had a lower endoscopy (such as a colonoscopy or flexible sigmoidoscopy) you may notice spotting of blood in your stool or on the toilet paper. If you underwent a bowel prep for your procedure, then you may not have a normal bowel movement for a few days.  DIET: Your first meal following the procedure should be a light meal and then it is ok to progress to your normal diet.  A half-sandwich or bowl of soup is an example of a good first meal.  Heavy or fried foods are harder to digest and may make you feel nauseous or bloated.  Likewise meals heavy in dairy and vegetables can cause extra gas to form and this can also increase the bloating.  Drink plenty of fluids but you should avoid alcoholic beverages for 24 hours.  ACTIVITY: Your care partner should take you home directly after the procedure.  You should plan to take it easy, moving slowly for the rest of the day.  You can resume normal activity the day after the procedure however you should NOT DRIVE or use heavy machinery for 24 hours (because of the sedation medicines used during the test).    SYMPTOMS  TO REPORT IMMEDIATELY: A gastroenterologist can be reached at any hour.  During normal business hours, 8:30 AM to 5:00 PM Monday through Friday, call [phone].  After hours and on weekends, please call the GI answering service at [phone] who will take a message and have the physician on call contact you.   Following lower endoscopy (colonoscopy or flexible sigmoidoscopy):  Excessive amounts of blood in the stool  Significant tenderness or worsening of abdominal pains  Swelling of the abdomen that is new, acute  Fever of 100F or higher  FOLLOW UP: If any biopsies were taken you will be contacted by phone or by letter within the next 1-3 weeks.  Call your gastroenterologist if you have not heard about the biopsies in 3 weeks.  Our staff will call the home number listed on your records the next business day following your procedure to check on you and address any questions or concerns that you may have at that time regarding the information given to you following your procedure. This is a courtesy call and so if there is no answer at the home number and we have not heard from you through the emergency physician on call, we will assume that you have returned to your regular daily activities without incident.  SIGNATURES/CONFIDENTIALITY: You and/or your care partner have signed paperwork which will be entered into your electronic medical record.  These signatures attest to the fact that that the information above  on your After Visit Summary has been reviewed and is understood.  Full responsibility of the confidentiality of this discharge information lies with you and/or your care-partner.

## 2013-05-15 NOTE — Progress Notes (Signed)
Patient did not experience any of the following events: a burn prior to discharge; a fall within the facility; wrong site/side/patient/procedure/implant event; or a hospital transfer or hospital admission upon discharge from the facility. (G8907) Patient did not have preoperative order for IV antibiotic SSI prophylaxis. (G8918)  

## 2013-05-15 NOTE — Op Note (Signed)
[facility] [address] [place], [address]   COLONOSCOPY PROCEDURE REPORT  PATIENT: Matthew Horne, Matthew Horne.  MR#: [id number] BIRTHDATE: 1961/05/14 , 52  yrs. old GENDER: Male ENDOSCOPIST: Mardella Layman, MD, [facility] REFERRED BY: PROCEDURE DATE:  05/15/2013 PROCEDURE:   Colonoscopy with tissue ablation First Screening Colonoscopy - Avg.  risk and is 50 yrs.  old or older - No.  Prior Negative Screening - Now for repeat screening. N/A  History of Adenoma - Now for follow-up colonoscopy & has been > or = to 3 yrs.  Yes hx of adenoma.  Has been 3 or more years since last colonoscopy.  Polyps Removed Today? Yes. ASA CLASS:   Class II INDICATIONS:Patient's personal history of adenomatous colon polyps.  MEDICATIONS: Propofol (Diprivan) and Propofol (Diprivan) 550 mg IV  DESCRIPTION OF PROCEDURE:   After the risks benefits and alternatives of the procedure were thoroughly explained, informed consent was obtained.  A digital rectal exam revealed no abnormalities of the rectum.   The     endoscope was introduced through the anus and advanced to the cecum, which was identified by both the appendix and ileocecal valve. No adverse events experienced.   The quality of the prep was excellent, using MoviPrep  The instrument was then slowly withdrawn as the colon was fully examined.      COLON FINDINGS: A normal appearing cecum, ileocecal valve, and appendiceal orifice were identified.  The ascending, hepatic flexure, transverse, splenic flexure, descending, sigmoid colon and rectum appeared unremarkable.  No polyps or cancers were seenThere was a linear fold leading into a depressed scar in proximat TV colon area.This appeared to be possible residual polyp tissue and was removed with hot snare and hot biopsy forceps....see pictures....then cauterized with cautery probe tip...see pictures.Otherwise normal exam..  Retroflexed views revealed no abnormalities. The time to  cecum=4 minutes 23 seconds.  Withdrawal time=23 minutes 400 seconds.  The scope was withdrawn and the procedure completed. COMPLICATIONS: There were no complications.  ENDOSCOPIC IMPRESSION: Normal colon .Marland Kitchen..residual fold of polyp tissue removed and cauterized.  RECOMMENDATIONS: Await pathology results...f/u probably 2-3 years depending on path   eSigned:  Mardella Layman, MD, [facility] 05/15/2013 2:13 PM   cc:   PATIENT NAME:  Matthew Horne, Matthew Horne. MR#: [id number]

## 2013-05-15 NOTE — Progress Notes (Signed)
Called to room to assist during endoscopic procedure.  Patient ID and intended procedure confirmed with present staff. Received instructions for my participation in the procedure from the performing physician.  

## 2013-05-18 ENCOUNTER — Telehealth: Payer: Self-pay | Admitting: *Deleted

## 2013-05-18 NOTE — Telephone Encounter (Signed)
Message left

## 2013-05-21 ENCOUNTER — Encounter: Payer: Self-pay | Admitting: Gastroenterology

## 2013-05-22 ENCOUNTER — Telehealth: Payer: Self-pay | Admitting: Gastroenterology

## 2013-05-22 NOTE — Telephone Encounter (Signed)
lmom for pt to call back

## 2013-05-22 NOTE — Telephone Encounter (Signed)
Informed pt of path results and 3 year recall; his letter should be mailed soon. Pt stated understanding.

## 2016-03-01 ENCOUNTER — Encounter: Payer: Self-pay | Admitting: Internal Medicine

## 2016-11-23 ENCOUNTER — Ambulatory Visit (INDEPENDENT_AMBULATORY_CARE_PROVIDER_SITE_OTHER): Payer: Managed Care, Other (non HMO) | Admitting: Primary Care

## 2016-11-23 ENCOUNTER — Other Ambulatory Visit: Payer: Self-pay | Admitting: Primary Care

## 2016-11-23 ENCOUNTER — Encounter: Payer: Self-pay | Admitting: Primary Care

## 2016-11-23 VITALS — BP 132/82 | HR 64 | Temp 97.9°F | Ht 71.25 in | Wt 221.0 lb

## 2016-11-23 DIAGNOSIS — N529 Male erectile dysfunction, unspecified: Secondary | ICD-10-CM | POA: Diagnosis not present

## 2016-11-23 DIAGNOSIS — E785 Hyperlipidemia, unspecified: Secondary | ICD-10-CM

## 2016-11-23 DIAGNOSIS — I10 Essential (primary) hypertension: Secondary | ICD-10-CM

## 2016-11-23 LAB — COMPREHENSIVE METABOLIC PANEL
ALBUMIN: 4.3 g/dL (ref 3.5–5.2)
ALK PHOS: 62 U/L (ref 39–117)
ALT: 30 U/L (ref 0–53)
AST: 20 U/L (ref 0–37)
BUN: 17 mg/dL (ref 6–23)
CHLORIDE: 102 meq/L (ref 96–112)
CO2: 31 mEq/L (ref 19–32)
CREATININE: 1.05 mg/dL (ref 0.40–1.50)
Calcium: 9.5 mg/dL (ref 8.4–10.5)
GFR: 77.32 mL/min (ref >60.00)
GLUCOSE: 105 mg/dL — AB (ref 70–99)
Potassium: 4.3 mEq/L (ref 3.5–5.1)
Sodium: 138 mEq/L (ref 135–145)
Total Bilirubin: 1.5 mg/dL — ABNORMAL HIGH (ref 0.2–1.2)
Total Protein: 7.2 g/dL (ref 6.0–8.3)

## 2016-11-23 LAB — LIPID PANEL
CHOL/HDL RATIO: 3
CHOLESTEROL: 126 mg/dL (ref 0–200)
HDL: 49.2 mg/dL (ref >39.00)
LDL CALC: 63 mg/dL (ref 0–99)
NonHDL: 76.57
TRIGLYCERIDES: 70 mg/dL (ref 0.0–149.0)
VLDL: 14 mg/dL (ref 0.0–40.0)

## 2016-11-23 MED ORDER — LISINOPRIL 10 MG PO TABS: 10.0000 mg | ORAL_TABLET | Freq: Every evening | ORAL | 3 refills | Status: DC

## 2016-11-23 MED ORDER — TADALAFIL 5 MG PO TABS: 5.0000 mg | ORAL_TABLET | Freq: Every day | ORAL | 5 refills | Status: DC | PRN

## 2016-11-23 MED ORDER — ATORVASTATIN CALCIUM 20 MG PO TABS: ORAL_TABLET | ORAL | 3 refills | Status: DC

## 2016-11-23 NOTE — Progress Notes (Signed)
Pre visit review using our clinic review tool, if applicable. No additional management support is needed unless otherwise documented below in the visit note. 

## 2016-11-23 NOTE — Assessment & Plan Note (Signed)
Stable in the office today, refills provided of Lisinopril 10 mg. BMP pending.

## 2016-11-23 NOTE — Assessment & Plan Note (Signed)
Did well on Cialis in the past, refill provided. He will notify if this is too expensive.

## 2016-11-23 NOTE — Patient Instructions (Addendum)
Complete lab work prior to leaving today. I will notify you of your results once received.   I will send refills of your medications through mail order later today once I receive your results.  Start exercising. You should be getting 150 minutes of moderate intensity exercise weekly.  You may take Cialis daily or every other day as discussed.   Follow up in 1 year for your annual physical.  It was a pleasure to meet you today! Please don't hesitate to call me with any questions. Welcome to [facility]!

## 2016-11-23 NOTE — Assessment & Plan Note (Signed)
Managed on Lipitor 20 mg for years.  Check Lipids and LFT's today. Refills will be sent through his mail order pharmacy.

## 2016-11-23 NOTE — Progress Notes (Signed)
Subjective:    Patient ID: Matthew Horne, male    DOB: Oct 31, 1960, 56 y.o.   MRN: [id number]  HPI  Matthew Horne is a 56 year old male who presents today to establish care and discuss the problems mentioned below. Will obtain old records. His last physical was years ago.   1) Essential Hypertension: Diagnosed 20 years ago. Currently managed on 10 mg. His BP is the office today is 132/82. He denies chest pain, cough, SOB, visual changes. He's been on the same dose for 20 years.   2) Hyperlipidemia: Diagnosed years ago. Currently managed on atorvastatin 20 mg. His last cholesterol check was 6 months ago and was normal. He is also managed on aspirin 81 mg daily. He is active but does not regularly exercise.   3) Erectile Dysfunction: Provided with samples of Cialis by his prior PCP several months ago. He took Cialis every other day for 60 days and noticed improvement. He has difficulty maintaining an erection. His last dose was 3 months ago. He would like a refill.  Review of Systems  Constitutional: Negative for fatigue.  Eyes: Negative for visual disturbance.  Respiratory: Negative for shortness of breath.   Cardiovascular: Negative for chest pain.  Genitourinary:       Difficulty maintaining an erection   Neurological: Negative for dizziness and headaches.       Past Medical History:  Diagnosis Date  . Contact lens/glasses fitting    wears contacts or glasses  . DDD (degenerative disc disease)   . Hyperlipidemia   . Hypertension      Social History   Social History  . Marital status: Married    Spouse name: N/A  . Number of children: N/A  . Years of education: N/A   Occupational History  . Not on file.   Social History Main Topics  . Smoking status: Light Tobacco Smoker    Types: Cigars  . Smokeless tobacco: Never Used  . Alcohol use 1.0 - 1.5 oz/week    2 - 3 Standard drinks or equivalent per week     Comment: social  . Drug use: No  . Sexual activity: Not on  file     Comment: occ cigar   Other Topics Concern  . Not on file   Social History Narrative   Married.    2 children.   3 grandchildren.    Works as a Building services engineer.   Enjoys spending time outdoors, fishing, farming.       Past Surgical History:  Procedure Laterality Date  . BACK SURGERY  2010, 2003   lumb lam/diskectomy  . COLONOSCOPY    . KNEE ARTHROSCOPY Left 02/03/2013   Procedure: LEFT KNEE ARTHROSCOPY, PARTIAL MEDIAL MENISCECTOMY, CHONDROPLASTY;  Surgeon: Hessie Dibble, MD;  [facility] [facility];  Service: Orthopedics;  Laterality: Left;  . TONSILLECTOMY  1967    Family History  Problem Relation Age of Onset  . Hyperlipidemia Mother   . Hypertension Mother   . Hyperlipidemia Father   . Hypertension Father   . Breast cancer Paternal Grandmother   . Colon cancer Neg Hx   . Esophageal cancer Neg Hx   . Rectal cancer Neg Hx   . Stomach cancer Neg Hx     No Known Allergies  Current Outpatient Prescriptions on File Prior to Visit  Medication Sig Dispense Refill  . cholecalciferol (VITAMIN D) 1000 UNITS tablet Take 1,000 Units by mouth daily.     Marland Kitchen lisinopril (PRINIVIL,ZESTRIL)  10 MG tablet Take 10 mg by mouth every evening.      No current facility-administered medications on file prior to visit.     BP 132/82   Pulse 64   Temp 97.9 F (36.6 C) (Oral)   Ht 5' 11.25" (1.81 m)   Wt 221 lb (100.2 kg)   SpO2 97%   BMI 30.61 kg/m    Objective:   Physical Exam  Constitutional: He is oriented to person, place, and time. He appears well-nourished.  Neck: Neck supple.  Cardiovascular: Normal rate and regular rhythm.   Pulmonary/Chest: Effort normal and breath sounds normal. He has no wheezes. He has no rales.  Neurological: He is alert and oriented to person, place, and time.  Skin: Skin is warm and dry.  Psychiatric: He has a normal mood and affect.          Assessment & Plan:

## 2017-10-09 ENCOUNTER — Encounter: Payer: Self-pay | Admitting: Primary Care

## 2017-10-09 ENCOUNTER — Encounter: Payer: Self-pay | Admitting: *Deleted

## 2017-10-09 ENCOUNTER — Ambulatory Visit (INDEPENDENT_AMBULATORY_CARE_PROVIDER_SITE_OTHER): Payer: Managed Care, Other (non HMO) | Admitting: Primary Care

## 2017-10-09 VITALS — BP 116/70 | HR 78 | Temp 98.2°F | Ht 71.25 in | Wt 220.5 lb

## 2017-10-09 DIAGNOSIS — E785 Hyperlipidemia, unspecified: Secondary | ICD-10-CM

## 2017-10-09 DIAGNOSIS — I10 Essential (primary) hypertension: Secondary | ICD-10-CM | POA: Diagnosis not present

## 2017-10-09 DIAGNOSIS — Z125 Encounter for screening for malignant neoplasm of prostate: Secondary | ICD-10-CM

## 2017-10-09 DIAGNOSIS — Z1211 Encounter for screening for malignant neoplasm of colon: Secondary | ICD-10-CM | POA: Diagnosis not present

## 2017-10-09 LAB — COMPREHENSIVE METABOLIC PANEL
ALBUMIN: 4.2 g/dL (ref 3.5–5.2)
ALT: 37 U/L (ref 0–53)
AST: 24 U/L (ref 0–37)
Alkaline Phosphatase: 64 U/L (ref 39–117)
BILIRUBIN TOTAL: 1.7 mg/dL — AB (ref 0.2–1.2)
BUN: 14 mg/dL (ref 6–23)
CO2: 32 mEq/L (ref 19–32)
Calcium: 9.6 mg/dL (ref 8.4–10.5)
Chloride: 100 mEq/L (ref 96–112)
Creatinine, Ser: 1 mg/dL (ref 0.40–1.50)
GFR: 81.54 mL/min (ref >60.00)
Glucose, Bld: 105 mg/dL — ABNORMAL HIGH (ref 70–99)
POTASSIUM: 4.2 meq/L (ref 3.5–5.1)
SODIUM: 137 meq/L (ref 135–145)
Total Protein: 6.9 g/dL (ref 6.0–8.3)

## 2017-10-09 LAB — LIPID PANEL
CHOLESTEROL: 131 mg/dL (ref 0–200)
HDL: 48.9 mg/dL (ref >39.00)
LDL Cholesterol: 69 mg/dL (ref 0–99)
NONHDL: 81.91
Total CHOL/HDL Ratio: 3
Triglycerides: 65 mg/dL (ref 0.0–149.0)
VLDL: 13 mg/dL (ref 0.0–40.0)

## 2017-10-09 LAB — HEMOGLOBIN A1C: HEMOGLOBIN A1C: 5.7 % (ref 4.6–6.5)

## 2017-10-09 LAB — PSA: PSA: 1.1 ng/mL (ref 0.10–4.00)

## 2017-10-09 MED ORDER — ATORVASTATIN CALCIUM 20 MG PO TABS: ORAL_TABLET | ORAL | 3 refills | Status: DC

## 2017-10-09 MED ORDER — LISINOPRIL 10 MG PO TABS: 10.0000 mg | ORAL_TABLET | Freq: Every evening | ORAL | 3 refills | Status: DC

## 2017-10-09 NOTE — Patient Instructions (Signed)
Stop by the lab prior to leaving today. I will notify you of your results once received.   You will be contacted regarding your referral to GI for the colonoscopy.  Please let us know if you have not been contacted within one week.   Be sure to eat a healthy diet with plenty of vegetables, fruit, whole grains, lean protein.  Ensure you are consuming 64 ounces of water daily.  It was a pleasure to see you today!

## 2017-10-09 NOTE — Progress Notes (Signed)
Subjective:    Patient ID: Matthew Horne, male    DOB: 1960-08-17, 57 y.o.   MRN: [id number]  HPI  Mr. Matthew Horne is a 57 year old male who presents today for medication refill. He is over due for colonoscopy. Last colonoscopy in 2014 with hyperplastic polyps, no adenomatous changes. He's also had no recent PSA screening.    1) Hyperlipidemia: Currently managed on atorvastatin 20 mg. Last lipid panel in April 2018 with LDL of 63. He denies myalgias.   2) Essential Hypertension: Currently managed on lisinopril 10 mg. He doesn't check his blood pressure at home. He denies chest pain, dizziness, headaches.   BP Readings from Last 3 Encounters:  10/09/17 116/70  11/23/16 132/82  05/15/13 (!) 126/58     Review of Systems  Eyes: Negative for visual disturbance.  Respiratory: Negative for shortness of breath.   Cardiovascular: Negative for chest pain.  Musculoskeletal: Negative for myalgias.  Neurological: Negative for dizziness and headaches.       Past Medical History:  Diagnosis Date  . Contact lens/glasses fitting    wears contacts or glasses  . DDD (degenerative disc disease)   . Hyperlipidemia   . Hypertension      Social History   Socioeconomic History  . Marital status: Married    Spouse name: Not on file  . Number of children: Not on file  . Years of education: Not on file  . Highest education level: Not on file  Social Needs  . Financial resource strain: Not on file  . Food insecurity - worry: Not on file  . Food insecurity - inability: Not on file  . Transportation needs - medical: Not on file  . Transportation needs - non-medical: Not on file  Occupational History  . Not on file  Tobacco Use  . Smoking status: Light Tobacco Smoker    Types: Cigars  . Smokeless tobacco: Never Used  Substance and Sexual Activity  . Alcohol use: Yes    Alcohol/week: 1.0 - 1.5 oz    Types: 2 - 3 Standard drinks or equivalent per week    Comment: social  . Drug use: No  .  Sexual activity: Not on file    Comment: occ cigar  Other Topics Concern  . Not on file  Social History Narrative   Married.    2 children.   3 grandchildren.    Works as a Building services engineer.   Enjoys spending time outdoors, fishing, farming.    Past Surgical History:  Procedure Laterality Date  . BACK SURGERY  2010, 2003   lumb lam/diskectomy  . COLONOSCOPY    . KNEE ARTHROSCOPY Left 02/03/2013   Procedure: LEFT KNEE ARTHROSCOPY, PARTIAL MEDIAL MENISCECTOMY, CHONDROPLASTY;  Surgeon: Hessie Dibble, MD;  Location: [place];  Service: Orthopedics;  Laterality: Left;  . TONSILLECTOMY  1967    Family History  Problem Relation Age of Onset  . Hyperlipidemia Mother   . Hypertension Mother   . Hyperlipidemia Father   . Hypertension Father   . Breast cancer Paternal Grandmother   . Colon cancer Neg Hx   . Esophageal cancer Neg Hx   . Rectal cancer Neg Hx   . Stomach cancer Neg Hx     No Known Allergies  Current Outpatient Medications on File Prior to Visit  Medication Sig Dispense Refill  . aspirin 81 MG tablet Take by mouth.    . cholecalciferol (VITAMIN D) 1000 UNITS tablet Take 1,000 Units by  mouth daily.     . tadalafil (CIALIS) 5 MG tablet Take 1 tablet (5 mg total) by mouth daily as needed for erectile dysfunction. 30 tablet 5   No current facility-administered medications on file prior to visit.     BP 116/70   Pulse 78   Temp 98.2 F (36.8 C) (Oral)   Ht 5' 11.25" (1.81 m)   Wt 220 lb 8 oz (100 kg)   SpO2 96%   BMI 30.54 kg/m    Objective:   Physical Exam  Constitutional: He is oriented to person, place, and time. He appears well-nourished.  Neck: Neck supple.  Cardiovascular: Normal rate and regular rhythm.  Pulmonary/Chest: Effort normal and breath sounds normal. He has no wheezes. He has no rales.  Neurological: He is alert and oriented to person, place, and time.  Skin: Skin is warm and dry.          Assessment & Plan:    Health Maintenance:  Referral to GI for colonoscopy placed. PSA pending.  [name], NP

## 2017-10-09 NOTE — Assessment & Plan Note (Addendum)
Due for repeat lipids today, continue atorvastatin. Refills sent to pharmacy. Check A1C given hyperglycemia on last labs.

## 2017-10-09 NOTE — Assessment & Plan Note (Addendum)
Stable in the office today, continue lisinopril 10 mg. BMP pending.

## 2017-10-11 ENCOUNTER — Encounter: Payer: Self-pay | Admitting: *Deleted

## 2017-11-13 ENCOUNTER — Encounter: Payer: Self-pay | Admitting: Primary Care

## 2017-11-29 ENCOUNTER — Encounter: Payer: Self-pay | Admitting: Internal Medicine

## 2018-02-11 ENCOUNTER — Ambulatory Visit (AMBULATORY_SURGERY_CENTER): Payer: Self-pay

## 2018-02-11 ENCOUNTER — Other Ambulatory Visit: Payer: Self-pay

## 2018-02-11 ENCOUNTER — Encounter: Payer: Self-pay | Admitting: Internal Medicine

## 2018-02-11 VITALS — Ht 71.0 in | Wt 227.6 lb

## 2018-02-11 DIAGNOSIS — Z86010 Personal history of colonic polyps: Secondary | ICD-10-CM

## 2018-02-11 MED ORDER — NA SULFATE-K SULFATE-MG SULF 17.5-3.13-1.6 GM/177ML PO SOLN
1.0000 | Freq: Once | ORAL | 0 refills | Status: AC
Start: 2018-02-11 — End: 2018-02-11

## 2018-02-11 NOTE — Progress Notes (Signed)
Denies allergies to eggs or soy products. Denies complication of anesthesia or sedation. Denies use of weight loss medication. Denies use of O2.   Emmi instructions declined.   A sample of Suprep was given to patient because his new insurance has a high deductible, and he has not met it the deductible.

## 2018-02-25 ENCOUNTER — Ambulatory Visit (AMBULATORY_SURGERY_CENTER): Payer: Managed Care, Other (non HMO) | Admitting: Internal Medicine

## 2018-02-25 ENCOUNTER — Encounter: Payer: Self-pay | Admitting: Internal Medicine

## 2018-02-25 VITALS — BP 101/61 | HR 52 | Temp 97.3°F | Resp 8 | Ht 71.0 in | Wt 227.0 lb

## 2018-02-25 DIAGNOSIS — D125 Benign neoplasm of sigmoid colon: Secondary | ICD-10-CM | POA: Diagnosis not present

## 2018-02-25 DIAGNOSIS — Z86010 Personal history of colonic polyps: Secondary | ICD-10-CM | POA: Diagnosis present

## 2018-02-25 DIAGNOSIS — K635 Polyp of colon: Secondary | ICD-10-CM

## 2018-02-25 DIAGNOSIS — D120 Benign neoplasm of cecum: Secondary | ICD-10-CM

## 2018-02-25 DIAGNOSIS — D122 Benign neoplasm of ascending colon: Secondary | ICD-10-CM

## 2018-02-25 MED ORDER — SODIUM CHLORIDE 0.9 % IV SOLN: 500.0000 mL | Freq: Once | INTRAVENOUS | Status: DC

## 2018-02-25 NOTE — Op Note (Signed)
[facility] Patient Name: Matthew Horne Procedure Date: 02/25/2018 9:16 AM MRN: [id number] Endoscopist: Jerene Bears , MD Age: 57 Referring MD:  Date of Birth: 11-Oct-1960 Gender: Male Account #: [id number] Procedure:                Colonoscopy Indications:              High risk colon cancer surveillance: Personal                            history of sessile serrated colon polyp (10 mm or                            greater in size), Last colonoscopy 5 years ago Medicines:                Monitored Anesthesia Care Procedure:                Pre-Anesthesia Assessment:                           - Prior to the procedure, a History and Physical                            was performed, and patient medications and                            allergies were reviewed. The patient's tolerance of                            previous anesthesia was also reviewed. The risks                            and benefits of the procedure and the sedation                            options and risks were discussed with the patient.                            All questions were answered, and informed consent                            was obtained. Prior Anticoagulants: The patient has                            taken no previous anticoagulant or antiplatelet                            agents. ASA Grade Assessment: II - A patient with                            mild systemic disease. After reviewing the risks                            and benefits, the patient was deemed in  satisfactory condition to undergo the procedure.                           After obtaining informed consent, the colonoscope                            was passed under direct vision. Throughout the                            procedure, the patient's blood pressure, pulse, and                            oxygen saturations were monitored continuously. The                            Colonoscope was  introduced through the anus and                            advanced to the cecum, identified by appendiceal                            orifice and ileocecal valve. The colonoscopy was                            performed without difficulty. The patient tolerated                            the procedure well. The quality of the bowel                            preparation was good. The ileocecal valve,                            appendiceal orifice, and rectum were photographed. Scope In: 9:25:52 AM Scope Out: 9:53:03 AM Scope Withdrawal Time: 0 hours 22 minutes 13 seconds  Total Procedure Duration: 0 hours 27 minutes 11 seconds  Findings:                 The digital rectal exam was normal.                           A 6 mm polyp was found in the cecum. The polyp was                            sessile. The polyp was removed with a cold snare.                            Resection and retrieval were complete.                           Two sessile polyps were found in the ascending                            colon. The polyps were 5 to  9 mm in size. These                            polyps were removed with a cold snare. Resection                            and retrieval were complete.                           Three sessile polyps were found in the sigmoid                            colon. The polyps were 4 to 10 mm in size. These                            polyps were removed with a cold snare. Resection                            and retrieval were complete.                           Internal hemorrhoids were found during                            retroflexion. The hemorrhoids were small. Complications:            No immediate complications. Estimated Blood Loss:     Estimated blood loss was minimal. Impression:               - One 6 mm polyp in the cecum, removed with a cold                            snare. Resected and retrieved.                           - Two 5 to 9 mm polyps in the  ascending colon,                            removed with a cold snare. Resected and retrieved.                           - Three 4 to 10 mm polyps in the sigmoid colon,                            removed with a cold snare. Resected and retrieved.                           - Small internal hemorrhoids. Recommendation:           - Patient has a contact number available for                            emergencies. The signs and symptoms of potential  delayed complications were discussed with the                            patient. Return to normal activities tomorrow.                            Written discharge instructions were provided to the                            patient.                           - Resume previous diet.                           - Continue present medications.                           - Await pathology results.                           - Repeat colonoscopy is recommended for                            surveillance. The colonoscopy date will be                            determined after pathology results from today's                            exam become available for review. Jerene Bears, MD 02/25/2018 10:04:41 AM This report has been signed electronically.

## 2018-02-25 NOTE — Progress Notes (Signed)
Called to room to assist during endoscopic procedure.  Patient ID and intended procedure confirmed with present staff. Received instructions for my participation in the procedure from the performing physician.  

## 2018-02-25 NOTE — Progress Notes (Signed)
To PACU, VSS. Report to [email or web address] 

## 2018-02-25 NOTE — Progress Notes (Signed)
No problems noted in the recovery room. maw 

## 2018-02-25 NOTE — Patient Instructions (Signed)
YOU HAD AN ENDOSCOPIC PROCEDURE TODAY AT THE [facility]:   Refer to the procedure report that was given to you for any specific questions about what was found during the examination.  If the procedure report does not answer your questions, please call your gastroenterologist to clarify.  If you requested that your care partner not be given the details of your procedure findings, then the procedure report has been included in a sealed envelope for you to review at your convenience later.  YOU SHOULD EXPECT: Some feelings of bloating in the abdomen. Passage of more gas than usual.  Walking can help get rid of the air that was put into your GI tract during the procedure and reduce the bloating. If you had a lower endoscopy (such as a colonoscopy or flexible sigmoidoscopy) you may notice spotting of blood in your stool or on the toilet paper. If you underwent a bowel prep for your procedure, you may not have a normal bowel movement for a few days.  Please Note:  You might notice some irritation and congestion in your nose or some drainage.  This is from the oxygen used during your procedure.  There is no need for concern and it should clear up in a day or so.  SYMPTOMS TO REPORT IMMEDIATELY:   Following lower endoscopy (colonoscopy or flexible sigmoidoscopy):  Excessive amounts of blood in the stool  Significant tenderness or worsening of abdominal pains  Swelling of the abdomen that is new, acute  Fever of 100F or higher   For urgent or emergent issues, a gastroenterologist can be reached at any hour by calling [phone].   DIET:  We do recommend a small meal at first, but then you may proceed to your regular diet.  Drink plenty of fluids but you should avoid alcoholic beverages for 24 hours.  ACTIVITY:  You should plan to take it easy for the rest of today and you should NOT DRIVE or use heavy machinery until tomorrow (because of the sedation medicines used during the test).     FOLLOW UP: Our staff will call the number listed on your records the next business day following your procedure to check on you and address any questions or concerns that you may have regarding the information given to you following your procedure. If we do not reach you, we will leave a message.  However, if you are feeling well and you are not experiencing any problems, there is no need to return our call.  We will assume that you have returned to your regular daily activities without incident.  If any biopsies were taken you will be contacted by phone or by letter within the next 1-3 weeks.  Please call us at [phone] if you have not heard about the biopsies in 3 weeks.    SIGNATURES/CONFIDENTIALITY: You and/or your care partner have signed paperwork which will be entered into your electronic medical record.  These signatures attest to the fact that that the information above on your After Visit Summary has been reviewed and is understood.  Full responsibility of the confidentiality of this discharge information lies with you and/or your care-partner.    Handouts were given to your care partner on polyps and hemorrhoids. You may resume your current medications today. Await biopsy results. Please call if any questions or concerns.   

## 2018-02-26 ENCOUNTER — Telehealth: Payer: Self-pay | Admitting: *Deleted

## 2018-02-26 NOTE — Telephone Encounter (Signed)
No answer for second post procedure call back. Left message for patient to call back with questions or concerns. SM 

## 2018-02-26 NOTE — Telephone Encounter (Signed)
No answer for post procedure call left message and will attempt to call back later this afternoon. Sm

## 2018-03-01 ENCOUNTER — Encounter: Payer: Self-pay | Admitting: Internal Medicine

## 2018-05-18 ENCOUNTER — Other Ambulatory Visit: Payer: Self-pay | Admitting: Primary Care

## 2018-05-18 DIAGNOSIS — E785 Hyperlipidemia, unspecified: Secondary | ICD-10-CM

## 2018-08-30 ENCOUNTER — Other Ambulatory Visit: Payer: Self-pay | Admitting: Primary Care

## 2018-08-30 DIAGNOSIS — I10 Essential (primary) hypertension: Secondary | ICD-10-CM

## 2018-08-31 ENCOUNTER — Other Ambulatory Visit: Payer: Self-pay | Admitting: Primary Care

## 2018-08-31 DIAGNOSIS — I10 Essential (primary) hypertension: Secondary | ICD-10-CM

## 2018-10-27 ENCOUNTER — Other Ambulatory Visit: Payer: Self-pay | Admitting: Primary Care

## 2018-10-27 DIAGNOSIS — E785 Hyperlipidemia, unspecified: Secondary | ICD-10-CM

## 2018-10-27 MED ORDER — ATORVASTATIN CALCIUM 20 MG PO TABS: 20.0000 mg | ORAL_TABLET | Freq: Every day | ORAL | 0 refills | Status: DC

## 2018-10-27 NOTE — Addendum Note (Signed)
Addended by: Jacqualin Combes on: 10/27/2018 03:38 PM   Modules accepted: Orders

## 2018-11-22 ENCOUNTER — Other Ambulatory Visit: Payer: Self-pay | Admitting: Primary Care

## 2018-11-22 DIAGNOSIS — I10 Essential (primary) hypertension: Secondary | ICD-10-CM

## 2018-11-28 ENCOUNTER — Encounter: Payer: Self-pay | Admitting: Primary Care

## 2018-11-28 ENCOUNTER — Ambulatory Visit (INDEPENDENT_AMBULATORY_CARE_PROVIDER_SITE_OTHER): Payer: Managed Care, Other (non HMO) | Admitting: Primary Care

## 2018-11-28 DIAGNOSIS — N529 Male erectile dysfunction, unspecified: Secondary | ICD-10-CM

## 2018-11-28 DIAGNOSIS — R7303 Prediabetes: Secondary | ICD-10-CM | POA: Diagnosis not present

## 2018-11-28 DIAGNOSIS — I10 Essential (primary) hypertension: Secondary | ICD-10-CM

## 2018-11-28 DIAGNOSIS — E785 Hyperlipidemia, unspecified: Secondary | ICD-10-CM | POA: Diagnosis not present

## 2018-11-28 MED ORDER — TADALAFIL 5 MG PO TABS: 5.0000 mg | ORAL_TABLET | Freq: Every day | ORAL | 0 refills | Status: DC | PRN

## 2018-11-28 MED ORDER — ATORVASTATIN CALCIUM 20 MG PO TABS: 20.0000 mg | ORAL_TABLET | Freq: Every day | ORAL | 3 refills | Status: DC

## 2018-11-28 MED ORDER — LISINOPRIL 10 MG PO TABS: 10.0000 mg | ORAL_TABLET | Freq: Every evening | ORAL | 3 refills | Status: DC

## 2018-11-28 NOTE — Patient Instructions (Signed)
Call to schedule a lab only appointment for early next week. Make sure to fast four hours prior.  I will send refills of your medications to your pharmacy.  Start exercising. You should be getting 150 minutes of moderate intensity exercise weekly.  It's important to improve your diet by reducing consumption of fast food, fried food, processed snack foods, sugary drinks. Increase consumption of fresh vegetables and fruits, whole grains, water.  Ensure you are drinking 64 ounces of water daily.  It was a pleasure to see you today! Allie Bossier, NP-C

## 2018-11-28 NOTE — Assessment & Plan Note (Signed)
A1C of 5.7 in 2019, repeat A1C pending.  Discussed the importance of a healthy diet and regular exercise in order for weight loss, and to reduce the risk of any potential medical problems.

## 2018-11-28 NOTE — Assessment & Plan Note (Signed)
Doing well on PRN tadalafil, refill sent to pharmacy.

## 2018-11-28 NOTE — Assessment & Plan Note (Signed)
Endorses stable reading at home 2 months ago. Will have him come in for nurse BP visit and labs.  Refill sent to pharmacy.

## 2018-11-28 NOTE — Progress Notes (Signed)
Subjective:    Patient ID: Matthew Horne, male    DOB: 05/29/1961, 58 y.o.   MRN: [id number]  HPI  Virtual Visit via Video Note  I connected with Matthew Horne on 11/28/18 at  8:20 AM EDT by a video enabled telemedicine application and verified that I am speaking with the correct person using two identifiers.  Location: Patient: Home Provider: Office   I discussed the limitations of evaluation and management by telemedicine and the availability of in person appointments. The patient expressed understanding and agreed to proceed.  History of Present Illness:  Matthew Horne is a 58 year old male who presents today for medication refill and follow up.  1) Essential Hypertension: Currently managed on lisinopril 10 mg. He doesn't check his BP at home regularly. He did check his BP 2 months ago and thinks it was 120/80. He denies chest pain, dizziness, shortness of breath, headaches.   2) Hyperlipidemia: Currently managed on atorvastatin 20 mg. His last lipid panel was in March of 2019 with LDL of 69. He is compliant to his atorvastatin.   3) Prediabetes: A1C of 5.7 in March 2019. 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   4) Erectile Dysfunction: Currently managed on tadalafil 5 mg. PRN. Overall doing well and is requesting refills.    Observations/Objective:  Alert and oriented. No distress. Appears well.  Assessment and Plan:  See problem based charting.  Follow Up Instructions:  Call to schedule a lab only appointment for early next week. Make sure to fast four hours prior.  I will send refills of your medications to your pharmacy.  Start exercising. You should be getting 150 minutes of moderate intensity exercise weekly.  It's important to improve your diet by reducing consumption of fast food, fried food, processed snack foods, sugary drinks. Increase consumption of fresh vegetables and fruits, whole grains, water.  Ensure you are drinking 64 ounces of water daily.  It was a pleasure to see you today! Allie Bossier, NP-C    I discussed the assessment and treatment plan with the patient. The patient was provided an opportunity to ask questions and all were answered. The patient agreed with the plan and demonstrated an understanding of the instructions.   The patient was advised to call back or seek an in-person evaluation if the symptoms worsen or if the condition fails to improve as anticipated.    [name], NP    Review of Systems  Eyes: Negative for visual disturbance.  Respiratory: Negative for shortness of breath.   Cardiovascular: Negative for chest pain.  Neurological: Negative for dizziness and headaches.       Past Medical History:  Diagnosis Date  . Allergy   . Contact lens/glasses fitting    wears contacts or glasses  . DDD (degenerative disc disease)   . Hyperlipidemia   . Hypertension      Social History   Socioeconomic History  . Marital status: Married    Spouse name: Not on file  . Number of children: Not on file  . Years of education: Not on file  . Highest education level: Not on file  Occupational History  . Not on file  Social Needs  . Financial resource strain: Not on file  . Food insecurity:    Worry: Not on file    Inability: Not on file  . Transportation needs:    Medical: Not on file  Non-medical: Not on file  Tobacco Use  . Smoking status: Light Tobacco Smoker    Types: Cigars  . Smokeless tobacco: Never Used  . Tobacco comment: Cigar once in a while  Substance and Sexual Activity  . Alcohol use: Yes    Alcohol/week: 2.0 - 3.0 standard drinks    Types: 2 - 3 Standard drinks or equivalent per week    Comment: social  . Drug use: No  . Sexual activity: Not on file    Comment: occ cigar  Lifestyle  . Physical activity:    Days per week: Not on file    Minutes per session: Not on file  . Stress: Not on file  Relationships  .  Social connections:    Talks on phone: Not on file    Gets together: Not on file    Attends religious service: Not on file    Active member of club or organization: Not on file    Attends meetings of clubs or organizations: Not on file    Relationship status: Not on file  . Intimate partner violence:    Fear of current or ex partner: Not on file    Emotionally abused: Not on file    Physically abused: Not on file    Forced sexual activity: Not on file  Other Topics Concern  . Not on file  Social History Narrative   Married.    2 children.   3 grandchildren.    Works as a Building services engineer.   Enjoys spending time outdoors, fishing, farming.    Past Surgical History:  Procedure Laterality Date  . BACK SURGERY  2010, 2003   lumb lam/diskectomy  . COLONOSCOPY    . KNEE ARTHROSCOPY Left 02/03/2013   Procedure: LEFT KNEE ARTHROSCOPY, PARTIAL MEDIAL MENISCECTOMY, CHONDROPLASTY;  Surgeon: Hessie Dibble, MD;  Location: [place];  Service: Orthopedics;  Laterality: Left;  . TONSILLECTOMY  1967    Family History  Problem Relation Age of Onset  . Hyperlipidemia Mother   . Hypertension Mother   . Hyperlipidemia Father   . Hypertension Father   . Breast cancer Paternal Grandmother   . Colon cancer Neg Hx   . Esophageal cancer Neg Hx   . Rectal cancer Neg Hx   . Stomach cancer Neg Hx   . Liver cancer Neg Hx   . Pancreatic cancer Neg Hx     No Known Allergies  Current Outpatient Medications on File Prior to Visit  Medication Sig Dispense Refill  . aspirin 81 MG tablet Take by mouth.    Marland Kitchen atorvastatin (LIPITOR) 20 MG tablet Take 1 tablet (20 mg total) by mouth at bedtime. NEED APPOINTMENT FOR ANY MORE REFILLS 90 tablet 0  . cholecalciferol (VITAMIN D) 1000 UNITS tablet Take 1,000 Units by mouth daily.     Marland Kitchen lisinopril (PRINIVIL,ZESTRIL) 10 MG tablet Take 1 tablet (10 mg total) by mouth every evening. NEED AN APPOINTMENT FOR ANY MORE REFILLS 90 tablet 0  .  tadalafil (CIALIS) 5 MG tablet Take 1 tablet (5 mg total) by mouth daily as needed for erectile dysfunction. 30 tablet 5   No current facility-administered medications on file prior to visit.     There were no vitals taken for this visit.   Objective:   Physical Exam  Constitutional: He is oriented to person, place, and time. He appears well-nourished.  Respiratory: Effort normal.  Neurological: He is alert and oriented to person, place, and time.  Skin: Skin  is dry.  Psychiatric: He has a normal mood and affect.           Assessment & Plan:

## 2018-11-28 NOTE — Assessment & Plan Note (Signed)
Compliant to atorvastatin, repeat lipids pending. Continue current regimen.

## 2018-12-05 ENCOUNTER — Other Ambulatory Visit (INDEPENDENT_AMBULATORY_CARE_PROVIDER_SITE_OTHER): Payer: Managed Care, Other (non HMO)

## 2018-12-05 ENCOUNTER — Other Ambulatory Visit: Payer: Self-pay | Admitting: Primary Care

## 2018-12-05 ENCOUNTER — Other Ambulatory Visit: Payer: Self-pay

## 2018-12-05 DIAGNOSIS — E785 Hyperlipidemia, unspecified: Secondary | ICD-10-CM

## 2018-12-05 DIAGNOSIS — I10 Essential (primary) hypertension: Secondary | ICD-10-CM

## 2018-12-05 DIAGNOSIS — R7303 Prediabetes: Secondary | ICD-10-CM | POA: Diagnosis not present

## 2018-12-05 LAB — COMPREHENSIVE METABOLIC PANEL
ALT: 33 U/L (ref 0–53)
AST: 21 U/L (ref 0–37)
Albumin: 4.2 g/dL (ref 3.5–5.2)
Alkaline Phosphatase: 58 U/L (ref 39–117)
BUN: 12 mg/dL (ref 6–23)
CO2: 31 mEq/L (ref 19–32)
Calcium: 8.9 mg/dL (ref 8.4–10.5)
Chloride: 102 mEq/L (ref 96–112)
Creatinine, Ser: 0.93 mg/dL (ref 0.40–1.50)
GFR: 83.09 mL/min (ref >60.00)
Glucose, Bld: 93 mg/dL (ref 70–99)
Potassium: 4 mEq/L (ref 3.5–5.1)
Sodium: 139 mEq/L (ref 135–145)
Total Bilirubin: 1.5 mg/dL — ABNORMAL HIGH (ref 0.2–1.2)
Total Protein: 6.9 g/dL (ref 6.0–8.3)

## 2018-12-05 LAB — HEMOGLOBIN A1C: Hgb A1c MFr Bld: 5.8 % (ref 4.6–6.5)

## 2018-12-05 LAB — LIPID PANEL
Cholesterol: 124 mg/dL (ref 0–200)
HDL: 47.8 mg/dL (ref >39.00)
LDL Cholesterol: 56 mg/dL (ref 0–99)
NonHDL: 76.6
Total CHOL/HDL Ratio: 3
Triglycerides: 104 mg/dL (ref 0.0–149.0)
VLDL: 20.8 mg/dL (ref 0.0–40.0)

## 2018-12-08 ENCOUNTER — Encounter: Payer: Self-pay | Admitting: *Deleted

## 2019-10-20 ENCOUNTER — Other Ambulatory Visit: Payer: Self-pay | Admitting: Primary Care

## 2019-10-20 DIAGNOSIS — I10 Essential (primary) hypertension: Secondary | ICD-10-CM

## 2019-12-03 ENCOUNTER — Other Ambulatory Visit: Payer: Self-pay | Admitting: Primary Care

## 2019-12-03 DIAGNOSIS — E785 Hyperlipidemia, unspecified: Secondary | ICD-10-CM

## 2020-01-11 ENCOUNTER — Other Ambulatory Visit: Payer: Self-pay | Admitting: Primary Care

## 2020-01-11 DIAGNOSIS — I10 Essential (primary) hypertension: Secondary | ICD-10-CM

## 2020-02-02 ENCOUNTER — Other Ambulatory Visit: Payer: Self-pay | Admitting: Primary Care

## 2020-02-02 DIAGNOSIS — I10 Essential (primary) hypertension: Secondary | ICD-10-CM

## 2020-02-10 ENCOUNTER — Other Ambulatory Visit: Payer: Self-pay | Admitting: Primary Care

## 2020-02-10 DIAGNOSIS — N529 Male erectile dysfunction, unspecified: Secondary | ICD-10-CM

## 2020-02-13 ENCOUNTER — Other Ambulatory Visit: Payer: Self-pay | Admitting: Primary Care

## 2020-02-13 DIAGNOSIS — N529 Male erectile dysfunction, unspecified: Secondary | ICD-10-CM

## 2020-02-16 ENCOUNTER — Other Ambulatory Visit: Payer: Self-pay | Admitting: Primary Care

## 2020-02-16 DIAGNOSIS — N529 Male erectile dysfunction, unspecified: Secondary | ICD-10-CM

## 2020-02-17 NOTE — Addendum Note (Signed)
Addended by: Jacqualin Combes on: 02/17/2020 10:12 AM   Modules accepted: Orders

## 2020-02-18 ENCOUNTER — Other Ambulatory Visit: Payer: Self-pay | Admitting: Primary Care

## 2020-02-18 ENCOUNTER — Telehealth: Payer: Self-pay | Admitting: Primary Care

## 2020-02-18 DIAGNOSIS — N529 Male erectile dysfunction, unspecified: Secondary | ICD-10-CM

## 2020-02-18 NOTE — Telephone Encounter (Signed)
Patient's wife called office requested Tadalafil refill for patient. They've faxed prescription  request 3 times she said and wasn't sure if we were receiving them. She also wants to change pharmacy for this particular refill to the [facility]. She needs someone to either fax refill to [phone] or call it in at [phone].If you have any questions please call Ivin Booty at [phone].

## 2020-02-19 NOTE — Telephone Encounter (Signed)
Spoken to [place] and notified her that patient have not been seen for over a year. He needs an appointment for any more refills.  She verbalized understanding and will have patient call back to schedule the appoinment

## 2020-02-22 ENCOUNTER — Other Ambulatory Visit: Payer: Self-pay | Admitting: Primary Care

## 2020-02-22 DIAGNOSIS — E785 Hyperlipidemia, unspecified: Secondary | ICD-10-CM

## 2020-05-02 ENCOUNTER — Encounter: Payer: Self-pay | Admitting: Internal Medicine

## 2020-05-02 ENCOUNTER — Other Ambulatory Visit: Payer: Self-pay

## 2020-05-02 ENCOUNTER — Ambulatory Visit (INDEPENDENT_AMBULATORY_CARE_PROVIDER_SITE_OTHER): Payer: Managed Care, Other (non HMO) | Admitting: Internal Medicine

## 2020-05-02 ENCOUNTER — Other Ambulatory Visit: Payer: Self-pay | Admitting: Primary Care

## 2020-05-02 VITALS — BP 122/84 | HR 60 | Temp 97.5°F | Wt 226.0 lb

## 2020-05-02 DIAGNOSIS — R7303 Prediabetes: Secondary | ICD-10-CM

## 2020-05-02 DIAGNOSIS — M5441 Lumbago with sciatica, right side: Secondary | ICD-10-CM | POA: Diagnosis not present

## 2020-05-02 DIAGNOSIS — Z125 Encounter for screening for malignant neoplasm of prostate: Secondary | ICD-10-CM

## 2020-05-02 DIAGNOSIS — Z5320 Procedure and treatment not carried out because of patient's decision for unspecified reasons: Secondary | ICD-10-CM

## 2020-05-02 DIAGNOSIS — Z114 Encounter for screening for human immunodeficiency virus [HIV]: Secondary | ICD-10-CM

## 2020-05-02 DIAGNOSIS — Z1159 Encounter for screening for other viral diseases: Secondary | ICD-10-CM

## 2020-05-02 DIAGNOSIS — E785 Hyperlipidemia, unspecified: Secondary | ICD-10-CM

## 2020-05-02 DIAGNOSIS — I10 Essential (primary) hypertension: Secondary | ICD-10-CM

## 2020-05-02 MED ORDER — CYCLOBENZAPRINE HCL 10 MG PO TABS: 10.0000 mg | ORAL_TABLET | Freq: Every day | ORAL | 0 refills | Status: DC

## 2020-05-02 MED ORDER — PREDNISONE 10 MG PO TABS: ORAL_TABLET | ORAL | 0 refills | Status: DC

## 2020-05-02 NOTE — Progress Notes (Signed)
Subjective:    Patient ID: Matthew Horne, male    DOB: [birth year], 60 y.o.   MRN: [id number]  HPI  Patient presents the clinic today with complaint of back pain. This started 2 weeks ago. The pain is located in his mid to lower back. He describes the pain as sharp. The pain radiates into his right upper leg. He denies numbness, tingling or weakness of his lower extremities. He denies loss of bowel or bladder control. MRI from 2010 showed:  IMPRESSION:    1. Interval discectomy on the left at L5-S1 with improved appearance of the level and no residual recurrent disc protrusion. No nerve root compression or evidence of complication related to  the procedure identified.  2.  Unchanged central and right paracentral shallow protrusion L4- 5.   He underwent a lumbar discectomy/laminectomy in 2003, 2010.  He reports he had some leftover Prednisone for 5 days, which helped while he was on it. He is now taking Aleve with minimal relief.   Review of Systems      Past Medical History:  Diagnosis Date  . Allergy   . Contact lens/glasses fitting    wears contacts or glasses  . DDD (degenerative disc disease)   . Hyperlipidemia   . Hypertension     Current Outpatient Medications  Medication Sig Dispense Refill  . aspirin 81 MG tablet Take by mouth.    Marland Kitchen atorvastatin (LIPITOR) 20 MG tablet Take 1 tablet (20 mg total) by mouth at bedtime. NEED APPOINTMENT FOR ANY MORE REFILLS 90 tablet 0  . cholecalciferol (VITAMIN D) 1000 UNITS tablet Take 1,000 Units by mouth daily.     Marland Kitchen lisinopril (ZESTRIL) 10 MG tablet TAKE 1 TABLET BY MOUTH EVERY EVENING PLEASE SCHEDULE APPOINTMENT 30 tablet 0  . tadalafil (CIALIS) 5 MG tablet Take 1 tablet (5 mg total) by mouth daily as needed for erectile dysfunction. 30 tablet 0   No current facility-administered medications for this visit.    No Known Allergies  Family History  Problem Relation Age of Onset  . Hyperlipidemia Mother   . Hypertension Mother    . Hyperlipidemia Father   . Hypertension Father   . Breast cancer Paternal Grandmother   . Colon cancer Neg Hx   . Esophageal cancer Neg Hx   . Rectal cancer Neg Hx   . Stomach cancer Neg Hx   . Liver cancer Neg Hx   . Pancreatic cancer Neg Hx     Social History   Socioeconomic History  . Marital status: Married    Spouse name: Not on file  . Number of children: Not on file  . Years of education: Not on file  . Highest education level: Not on file  Occupational History  . Not on file  Tobacco Use  . Smoking status: Light Tobacco Smoker    Types: Cigars  . Smokeless tobacco: Never Used  . Tobacco comment: Cigar once in a while  Vaping Use  . Vaping Use: Never used  Substance and Sexual Activity  . Alcohol use: Yes    Alcohol/week: 2.0 - 3.0 standard drinks    Types: 2 - 3 Standard drinks or equivalent per week    Comment: social  . Drug use: No  . Sexual activity: Not on file    Comment: occ cigar  Other Topics Concern  . Not on file  Social History Narrative   Married.    2 children.   3 grandchildren.    Works  as a Building services engineer.   Enjoys spending time outdoors, fishing, farming.   Social Determinants of Health   Financial Resource Strain:   . Difficulty of Paying Living Expenses: Not on file  Food Insecurity:   . Worried About Charity fundraiser in the Last Year: Not on file  . Ran Out of Food in the Last Year: Not on file  Transportation Needs:   . Lack of Transportation (Medical): Not on file  . Lack of Transportation (Non-Medical): Not on file  Physical Activity:   . Days of Exercise per Week: Not on file  . Minutes of Exercise per Session: Not on file  Stress:   . Feeling of Stress : Not on file  Social Connections:   . Frequency of Communication with Friends and Family: Not on file  . Frequency of Social Gatherings with Friends and Family: Not on file  . Attends Religious Services: Not on file  . Active Member of Clubs or Organizations:  Not on file  . Attends Archivist Meetings: Not on file  . Marital Status: Not on file  Intimate Partner Violence:   . Fear of Current or Ex-Partner: Not on file  . Emotionally Abused: Not on file  . Physically Abused: Not on file  . Sexually Abused: Not on file     Constitutional: Denies fever, malaise, fatigue, headache or abrupt weight changes.  Respiratory: Denies difficulty breathing, shortness of breath, cough or sputum production.   Cardiovascular: Denies chest pain, chest tightness, palpitations or swelling in the hands or feet.  Gastrointestinal: Denies abdominal pain, bloating, constipation, diarrhea or blood in the stool.  GU: Denies urgency, frequency, pain with urination, burning sensation, blood in urine, odor or discharge. Musculoskeletal: Pt reports back pain. Denies decrease in range of motion, difficulty with gait, or joint swelling.  Skin: Denies redness, rashes, lesions or ulcercations.  Neurological: Denies numbness, tingling, weakness or problems with balance and coordination.    No other specific complaints in a complete review of systems (except as listed in HPI above).  Objective:   Physical Exam  BP 122/84   Pulse 60   Temp (!) 97.5 F (36.4 C) (Temporal)   Wt 226 lb (102.5 kg)   SpO2 98%   BMI 31.52 kg/m   Wt Readings from Last 3 Encounters:  02/25/18 227 lb (103 kg)  02/11/18 227 lb 9.6 oz (103.2 kg)  10/09/17 220 lb 8 oz (100 kg)    General: Appears her stated age, obese, in NAD. Skin: Warm, dry and intact. No rashes noted. Cardiovascular: Normal rate and rhythm. S1,S2 noted.  No murmur, rubs or gallops noted. Pedal pulses 2+ bilaterally. Pulmonary/Chest: Normal effort and positive vesicular breath sounds. No respiratory distress. No wheezes, rales or ronchi noted.  Musculoskeletal: Decreased flexion of the spine secondary to pain. Normal extension, rotation and lateral bending of the spine. No bony tenderness noted over the spine.  Strength 5/5 BLE. No difficulty with gait.  Neurological: Alert and oriented. Positive SLR on the right.   BMET    Component Value Date/Time   NA 139 12/05/2018 1126   K 4.0 12/05/2018 1126   CL 102 12/05/2018 1126   CO2 31 12/05/2018 1126   GLUCOSE 93 12/05/2018 1126   BUN 12 12/05/2018 1126   CREATININE 0.93 12/05/2018 1126   CALCIUM 8.9 12/05/2018 1126   GFRNONAA >90 01/29/2013 1210   GFRAA >90 01/29/2013 1210    Lipid Panel     Component Value Date/Time  CHOL 124 12/05/2018 1126   TRIG 104.0 12/05/2018 1126   HDL 47.80 12/05/2018 1126   CHOLHDL 3 12/05/2018 1126   VLDL 20.8 12/05/2018 1126   LDLCALC 56 12/05/2018 1126    CBC    Component Value Date/Time   WBC 6.1 03/16/2009 0942   RBC 4.69 03/16/2009 0942   HGB 16.5 02/03/2013 1417   HCT 44.6 03/16/2009 0942   PLT 211 03/16/2009 0942   MCV 95.0 03/16/2009 0942   MCHC 34.5 03/16/2009 0942   RDW 12.5 03/16/2009 0942    Hgb A1C Lab Results  Component Value Date   HGBA1C 5.8 12/05/2018          Assessment & Plan:   Acute Right Sided Low Back Pain with Right Sided Sciatica:  No indication for repeat xray at this time RX for Pred Taper x 9 days- avoid NSAID's OTC RX for Flexeril 10 mg QHS- sedation caution given Encouraged stretching, ice and massage If persists or worsens, consider MRI given his prior back surgeries  Return precaution discussed Webb Silversmith, NP This visit occurred during the SARS-CoV-2 public health emergency.  Safety protocols were in place, including screening questions prior to the visit, additional usage of staff PPE, and extensive cleaning of exam room while observing appropriate contact time as indicated for disinfecting solutions.

## 2020-05-02 NOTE — Patient Instructions (Signed)

## 2020-05-09 ENCOUNTER — Other Ambulatory Visit (INDEPENDENT_AMBULATORY_CARE_PROVIDER_SITE_OTHER): Payer: Managed Care, Other (non HMO)

## 2020-05-09 ENCOUNTER — Other Ambulatory Visit: Payer: Self-pay

## 2020-05-09 DIAGNOSIS — Z125 Encounter for screening for malignant neoplasm of prostate: Secondary | ICD-10-CM

## 2020-05-09 DIAGNOSIS — Z114 Encounter for screening for human immunodeficiency virus [HIV]: Secondary | ICD-10-CM

## 2020-05-09 DIAGNOSIS — I10 Essential (primary) hypertension: Secondary | ICD-10-CM | POA: Diagnosis not present

## 2020-05-09 DIAGNOSIS — Z1159 Encounter for screening for other viral diseases: Secondary | ICD-10-CM

## 2020-05-09 DIAGNOSIS — R7303 Prediabetes: Secondary | ICD-10-CM

## 2020-05-09 DIAGNOSIS — E785 Hyperlipidemia, unspecified: Secondary | ICD-10-CM | POA: Diagnosis not present

## 2020-05-09 LAB — COMPREHENSIVE METABOLIC PANEL
ALT: 46 U/L (ref 0–53)
AST: 18 U/L (ref 0–37)
Albumin: 4.2 g/dL (ref 3.5–5.2)
Alkaline Phosphatase: 56 U/L (ref 39–117)
BUN: 20 mg/dL (ref 6–23)
CO2: 33 mEq/L — ABNORMAL HIGH (ref 19–32)
Calcium: 9.5 mg/dL (ref 8.4–10.5)
Chloride: 101 mEq/L (ref 96–112)
Creatinine, Ser: 1.16 mg/dL (ref 0.40–1.50)
GFR: 67.74 mL/min (ref >60.00)
Glucose, Bld: 84 mg/dL (ref 70–99)
Potassium: 4 mEq/L (ref 3.5–5.1)
Sodium: 140 mEq/L (ref 135–145)
Total Bilirubin: 1.9 mg/dL — ABNORMAL HIGH (ref 0.2–1.2)
Total Protein: 6.8 g/dL (ref 6.0–8.3)

## 2020-05-09 LAB — CBC
HCT: 47.2 % (ref 39.0–52.0)
Hemoglobin: 15.9 g/dL (ref 13.0–17.0)
MCHC: 33.7 g/dL (ref 30.0–36.0)
MCV: 97 fl (ref 78.0–100.0)
Platelets: 264 10*3/uL (ref 150.0–400.0)
RBC: 4.87 Mil/uL (ref 4.22–5.81)
RDW: 13.2 % (ref 11.5–15.5)
WBC: 11.3 10*3/uL — ABNORMAL HIGH (ref 4.0–10.5)

## 2020-05-09 LAB — HEMOGLOBIN A1C: Hgb A1c MFr Bld: 6 % (ref 4.6–6.5)

## 2020-05-09 LAB — LIPID PANEL
Cholesterol: 142 mg/dL (ref 0–200)
HDL: 57.5 mg/dL (ref >39.00)
LDL Cholesterol: 60 mg/dL (ref 0–99)
NonHDL: 84.89
Total CHOL/HDL Ratio: 2
Triglycerides: 123 mg/dL (ref 0.0–149.0)
VLDL: 24.6 mg/dL (ref 0.0–40.0)

## 2020-05-09 LAB — PSA: PSA: 0.93 ng/mL (ref 0.10–4.00)

## 2020-05-10 LAB — HEPATITIS C ANTIBODY
Hepatitis C Ab: NONREACTIVE
SIGNAL TO CUT-OFF: 0.31 (ref <1.00)

## 2020-05-10 LAB — HIV ANTIBODY (ROUTINE TESTING W REFLEX): HIV 1&2 Ab, 4th Generation: NONREACTIVE

## 2020-05-16 ENCOUNTER — Other Ambulatory Visit: Payer: Self-pay

## 2020-05-16 ENCOUNTER — Ambulatory Visit (INDEPENDENT_AMBULATORY_CARE_PROVIDER_SITE_OTHER): Payer: Managed Care, Other (non HMO) | Admitting: Primary Care

## 2020-05-16 ENCOUNTER — Encounter: Payer: Self-pay | Admitting: Primary Care

## 2020-05-16 VITALS — BP 126/82 | HR 71 | Temp 97.6°F | Ht 71.0 in | Wt 222.0 lb

## 2020-05-16 DIAGNOSIS — I10 Essential (primary) hypertension: Secondary | ICD-10-CM

## 2020-05-16 DIAGNOSIS — M5441 Lumbago with sciatica, right side: Secondary | ICD-10-CM

## 2020-05-16 DIAGNOSIS — Z23 Encounter for immunization: Secondary | ICD-10-CM

## 2020-05-16 DIAGNOSIS — E785 Hyperlipidemia, unspecified: Secondary | ICD-10-CM

## 2020-05-16 DIAGNOSIS — R7303 Prediabetes: Secondary | ICD-10-CM

## 2020-05-16 DIAGNOSIS — M545 Low back pain, unspecified: Secondary | ICD-10-CM | POA: Insufficient documentation

## 2020-05-16 DIAGNOSIS — Z0000 Encounter for general adult medical examination without abnormal findings: Secondary | ICD-10-CM | POA: Diagnosis not present

## 2020-05-16 DIAGNOSIS — Z0001 Encounter for general adult medical examination with abnormal findings: Secondary | ICD-10-CM | POA: Insufficient documentation

## 2020-05-16 MED ORDER — MELOXICAM 15 MG PO TABS: 15.0000 mg | ORAL_TABLET | Freq: Every day | ORAL | 0 refills | Status: DC

## 2020-05-16 MED ORDER — LISINOPRIL 10 MG PO TABS: 10.0000 mg | ORAL_TABLET | Freq: Every day | ORAL | 3 refills | Status: DC

## 2020-05-16 MED ORDER — ATORVASTATIN CALCIUM 20 MG PO TABS: 20.0000 mg | ORAL_TABLET | Freq: Every day | ORAL | 3 refills | Status: DC

## 2020-05-16 NOTE — Patient Instructions (Addendum)
Start meloxicam once daily for pain. Do NOT taking naproxen/Aleve, ibuprofen/Motrin/Advil while taking this.  You may take Tylenol, do not exceed 3000 mg in 24 hours.  Stretch daily as discussed.  Start exercising. You should be getting 150 minutes of moderate intensity exercise weekly.  It's important to improve your diet by reducing consumption of fast food, fried food, processed snack foods, sugary drinks. Increase consumption of fresh vegetables and fruits, whole grains, water.  Ensure you are drinking 64 ounces of water daily.  Schedule a nurse visit for your second Shingrix in 2-6 months.  It was a pleasure to see you today!    Recombinant Zoster (Shingles) Vaccine: What You Need to Know 1. Why get vaccinated? Recombinant zoster (shingles) vaccine can prevent shingles. Shingles (also called herpes zoster, or just zoster) is a painful skin rash, usually with blisters. In addition to the rash, shingles can cause fever, headache, chills, or upset stomach. More rarely, shingles can lead to pneumonia, hearing problems, blindness, brain inflammation (encephalitis), or death. The most common complication of shingles is long-term nerve pain called postherpetic neuralgia (PHN). PHN occurs in the areas where the shingles rash was, even after the rash clears up. It can last for months or years after the rash goes away. The pain from PHN can be severe and debilitating. About 10 to 18% of people who get shingles will experience PHN. The risk of PHN increases with age. An older adult with shingles is more likely to develop PHN and have longer lasting and more severe pain than a younger person with shingles. Shingles is caused by the varicella zoster virus, the same virus that causes chickenpox. After you have chickenpox, the virus stays in your body and can cause shingles later in life. Shingles cannot be passed from one person to another, but the virus that causes shingles can spread and cause  chickenpox in someone who had never had chickenpox or received chickenpox vaccine. 2. Recombinant shingles vaccine Recombinant shingles vaccine provides strong protection against shingles. By preventing shingles, recombinant shingles vaccine also protects against PHN. Recombinant shingles vaccine is the preferred vaccine for the prevention of shingles. However, a different vaccine, live shingles vaccine, may be used in some circumstances. The recombinant shingles vaccine is recommended for adults 50 years and older without serious immune problems. It is given as a two-dose series. This vaccine is also recommended for people who have already gotten another type of shingles vaccine, the live shingles vaccine. There is no live virus in this vaccine. Shingles vaccine may be given at the same time as other vaccines. 3. Talk with your health care provider Tell your vaccine provider if the person getting the vaccine:  Has had an allergic reaction after a previous dose of recombinant shingles vaccine, or has any severe, life-threatening allergies.  Is pregnant or breastfeeding.  Is currently experiencing an episode of shingles. In some cases, your health care provider may decide to postpone shingles vaccination to a future visit. People with minor illnesses, such as a cold, may be vaccinated. People who are moderately or severely ill should usually wait until they recover before getting recombinant shingles vaccine. Your health care provider can give you more information. 4. Risks of a vaccine reaction  A sore arm with mild or moderate pain is very common after recombinant shingles vaccine, affecting about 80% of vaccinated people. Redness and swelling can also happen at the site of the injection.  Tiredness, muscle pain, headache, shivering, fever, stomach pain, and nausea happen  after vaccination in more than half of people who receive recombinant shingles vaccine. In clinical trials, about 1 out of  6 people who got recombinant zoster vaccine experienced side effects that prevented them from doing regular activities. Symptoms usually went away on their own in 2 to 3 days. You should still get the second dose of recombinant zoster vaccine even if you had one of these reactions after the first dose. People sometimes faint after medical procedures, including vaccination. Tell your provider if you feel dizzy or have vision changes or ringing in the ears. As with any medicine, there is a very remote chance of a vaccine causing a severe allergic reaction, other serious injury, or death. 5. What if there is a serious problem? An allergic reaction could occur after the vaccinated person leaves the clinic. If you see signs of a severe allergic reaction (hives, swelling of the face and throat, difficulty breathing, a fast heartbeat, dizziness, or weakness), call 9-1-1 and get the person to the nearest hospital. For other signs that concern you, call your health care provider. Adverse reactions should be reported to the Vaccine Adverse Event Reporting System (VAERS). Your health care provider will usually file this report, or you can do it yourself. Visit the VAERS website at www.vaers.SamedayNews.es or call 312 802 0616. VAERS is only for reporting reactions, and VAERS staff do not give medical advice. 6. How can I learn more?  Ask your health care provider.  Call your local or state health department.  Contact the Centers for Disease Control and Prevention (CDC): ? Call 937-632-8994 (1-800-CDC-INFO) or ? Visit CDC's website at http://hunter.com/ Vaccine Information Statement Recombinant Zoster Vaccine (05/28/2018) This information is not intended to replace advice given to you by your health care provider. Make sure you discuss any questions you have with your health care provider. Document Revised: 11/04/2018 Document Reviewed: 02/19/2018 Elsevier Patient Education  [place].

## 2020-05-16 NOTE — Assessment & Plan Note (Signed)
LDL at goal on atorvastatin 20 mg. Continue current regimen.

## 2020-05-16 NOTE — Progress Notes (Signed)
Subjective:    Patient ID: Matthew Horne, male    DOB: May 29, 1961, 59 y.o.   MRN: [id number]  HPI  This visit occurred during the SARS-CoV-2 public health emergency.  Safety protocols were in place, including screening questions prior to the visit, additional usage of staff PPE, and extensive cleaning of exam room while observing appropriate contact time as indicated for disinfecting solutions.   Matthew Horne is a 59 year old male who presents today for complete physical.  He would also like to discuss acute back pain that began one month ago. One afternoon he was doing yard work, and the next morning he woke up with severe right buttocks pain with radiation down right lower extremity.  He's undergone two surgeries to the lumbar spine years ago.   He's currently taking naproxen 440 BID without improvement. He's tried Tylenol without improvement. He was evaluated by Webb Silversmith, NP on 05/02/20 for symptoms, was prescribed prednisone and muscle relaxer. Today he's 90% improved, but still frustrated with lingering symptoms.   Immunizations: -Tetanus: Completed in 2013 -Influenza: Due -Shingles: Never completed  -Covid-19: Completed series  Diet: He endorses a fair diet.  Exercise: He is active, no regular exercise over the last one month.  Eye exam: No recent exam Dental exam: Completed semi-annually   Colonoscopy: Completed in 2019, due in 2022 PSA: 0.93 in 2021 Hep C Screen: Negative  BP Readings from Last 3 Encounters:  05/16/20 126/82  05/02/20 122/84  02/25/18 101/61      Review of Systems  Constitutional: Negative for unexpected weight change.  HENT: Negative for rhinorrhea.   Respiratory: Negative for cough and shortness of breath.   Cardiovascular: Negative for chest pain.  Gastrointestinal: Negative for constipation and diarrhea.  Genitourinary: Negative for difficulty urinating.  Musculoskeletal: Positive for arthralgias and back pain. Negative for myalgias.    Skin: Negative for rash.  Allergic/Immunologic: Negative for environmental allergies.  Neurological: Positive for numbness. Negative for dizziness and headaches.  Psychiatric/Behavioral: The patient is not nervous/anxious.        Past Medical History:  Diagnosis Date  . Allergy   . Contact lens/glasses fitting    wears contacts or glasses  . DDD (degenerative disc disease)   . Hyperlipidemia   . Hypertension      Social History   Socioeconomic History  . Marital status: Married    Spouse name: Not on file  . Number of children: Not on file  . Years of education: Not on file  . Highest education level: Not on file  Occupational History  . Not on file  Tobacco Use  . Smoking status: Light Tobacco Smoker    Types: Cigars  . Smokeless tobacco: Never Used  . Tobacco comment: Cigar once in a while  Vaping Use  . Vaping Use: Never used  Substance and Sexual Activity  . Alcohol use: Yes    Alcohol/week: 2.0 - 3.0 standard drinks    Types: 2 - 3 Standard drinks or equivalent per week    Comment: social  . Drug use: No  . Sexual activity: Not on file    Comment: occ cigar  Other Topics Concern  . Not on file  Social History Narrative   Married.    2 children.   3 grandchildren.    Works as a Building services engineer.   Enjoys spending time outdoors, fishing, farming.   Social Determinants of Health   Financial Resource Strain:   . Difficulty of Paying Living Expenses:  Not on file  Food Insecurity:   . Worried About Charity fundraiser in the Last Year: Not on file  . Ran Out of Food in the Last Year: Not on file  Transportation Needs:   . Lack of Transportation (Medical): Not on file  . Lack of Transportation (Non-Medical): Not on file  Physical Activity:   . Days of Exercise per Week: Not on file  . Minutes of Exercise per Session: Not on file  Stress:   . Feeling of Stress : Not on file  Social Connections:   . Frequency of Communication with Friends and Family:  Not on file  . Frequency of Social Gatherings with Friends and Family: Not on file  . Attends Religious Services: Not on file  . Active Member of Clubs or Organizations: Not on file  . Attends Archivist Meetings: Not on file  . Marital Status: Not on file  Intimate Partner Violence:   . Fear of Current or Ex-Partner: Not on file  . Emotionally Abused: Not on file  . Physically Abused: Not on file  . Sexually Abused: Not on file    Past Surgical History:  Procedure Laterality Date  . BACK SURGERY  2010, 2003   lumb lam/diskectomy  . COLONOSCOPY    . KNEE ARTHROSCOPY Left 02/03/2013   Procedure: LEFT KNEE ARTHROSCOPY, PARTIAL MEDIAL MENISCECTOMY, CHONDROPLASTY;  Surgeon: Hessie Dibble, MD;  Location: [facility];  Service: Orthopedics;  Laterality: Left;  . TONSILLECTOMY  1967    Family History  Problem Relation Age of Onset  . Hyperlipidemia Mother   . Hypertension Mother   . Hyperlipidemia Father   . Hypertension Father   . Breast cancer Paternal Grandmother   . Colon cancer Neg Hx   . Esophageal cancer Neg Hx   . Rectal cancer Neg Hx   . Stomach cancer Neg Hx   . Liver cancer Neg Hx   . Pancreatic cancer Neg Hx     No Known Allergies  Current Outpatient Medications on File Prior to Visit  Medication Sig Dispense Refill  . aspirin 81 MG tablet Take by mouth.    Marland Kitchen atorvastatin (LIPITOR) 20 MG tablet Take 1 tablet (20 mg total) by mouth at bedtime. NEED APPOINTMENT FOR ANY MORE REFILLS 90 tablet 0  . cholecalciferol (VITAMIN D) 1000 UNITS tablet Take 1,000 Units by mouth daily.     Marland Kitchen lisinopril (ZESTRIL) 10 MG tablet TAKE 1 TABLET BY MOUTH EVERY EVENING PLEASE SCHEDULE APPOINTMENT 30 tablet 0   No current facility-administered medications on file prior to visit.    BP 126/82   Pulse 71   Temp 97.6 F (36.4 C) (Temporal)   Ht 5\' 11"  (1.803 m)   Wt 222 lb (100.7 kg)   SpO2 98%   BMI 30.96 kg/m    Objective:   Physical Exam HENT:      Right Ear: Tympanic membrane and ear canal normal.     Left Ear: Tympanic membrane and ear canal normal.  Eyes:     Pupils: Pupils are equal, round, and reactive to light.  Cardiovascular:     Rate and Rhythm: Normal rate and regular rhythm.  Pulmonary:     Effort: Pulmonary effort is normal.     Breath sounds: Normal breath sounds.  Abdominal:     General: Bowel sounds are normal.     Palpations: Abdomen is soft.     Tenderness: There is no abdominal tenderness.  Musculoskeletal:  Cervical back: Neck supple.     Comments: Decrease in ROM to right lower extremity  Skin:    General: Skin is warm and dry.  Neurological:     Mental Status: He is alert and oriented to person, place, and time.     Cranial Nerves: No cranial nerve deficit.     Deep Tendon Reflexes:     Reflex Scores:      Patellar reflexes are 2+ on the right side and 2+ on the left side.           Assessment & Plan:

## 2020-05-16 NOTE — Assessment & Plan Note (Signed)
First Shingrix due, provided today.  Colonoscopy UTD, due in 2022. PSA UTD.  Discussed the importance of a healthy diet and regular exercise in order for weight loss, and to reduce the risk of any potential medical problems.  Exam today as noted. Labs reviewed.

## 2020-05-16 NOTE — Assessment & Plan Note (Signed)
Originally evaluated by Rollene Fare B a few weeks ago, he is 90% better, but continues to notice some symptoms.  Discussed the importance of stretching. Will trial Meloxicam 15 mg daily. Discussed to avoid other NSAID medications.  He will update in a month if no improvement.

## 2020-05-16 NOTE — Assessment & Plan Note (Signed)
Increase to A1C over the years with 6.0 level today.  Discussed the importance of a healthy diet and regular exercise in order for weight loss, and to reduce the risk of any potential medical problems.  Continue to monitor.

## 2020-05-16 NOTE — Assessment & Plan Note (Signed)
Well controlled in the office today, continue lisinopril 10 mg. CMP reviewed.

## 2020-05-18 NOTE — Addendum Note (Signed)
Addended by: Francella Solian on: 05/18/2020 02:25 PM   Modules accepted: Orders

## 2020-05-20 ENCOUNTER — Telehealth: Payer: Self-pay | Admitting: *Deleted

## 2020-05-20 DIAGNOSIS — N529 Male erectile dysfunction, unspecified: Secondary | ICD-10-CM

## 2020-05-20 MED ORDER — TADALAFIL 5 MG PO TABS: 5.0000 mg | ORAL_TABLET | Freq: Every day | ORAL | 0 refills | Status: DC | PRN

## 2020-05-20 NOTE — Telephone Encounter (Signed)
Please advise 

## 2020-05-20 NOTE — Telephone Encounter (Signed)
Please explain that the tadalifil wasn't on his medication list, Joellen did you discontinue? Anyway, I'll send in a new Rx now.

## 2020-05-20 NOTE — Telephone Encounter (Signed)
Received a call at Triage from pt's wife. Pt was here on 05/16/20 for CPE and PCP was going to fill all of pt's meds to [facility]. meds were filled except Tadalafil and wife is wanting to know why. I see it says it was d/c so not sure if that was an error or there is a reason Rx wasn't filled so will route to CMA for review. If approved they would like Rx sent to [facility]

## 2020-05-23 NOTE — Telephone Encounter (Signed)
Noted  

## 2020-05-23 NOTE — Telephone Encounter (Signed)
Yes when doing med reconciliation patient stated that he had not taken in over a year. And did not want refills?

## 2020-06-12 ENCOUNTER — Other Ambulatory Visit: Payer: Self-pay | Admitting: Primary Care

## 2020-06-12 DIAGNOSIS — M5441 Lumbago with sciatica, right side: Secondary | ICD-10-CM

## 2020-07-12 ENCOUNTER — Other Ambulatory Visit: Payer: Self-pay | Admitting: Primary Care

## 2020-07-12 DIAGNOSIS — M5441 Lumbago with sciatica, right side: Secondary | ICD-10-CM

## 2020-07-12 NOTE — Telephone Encounter (Signed)
Ok refill? 

## 2020-07-12 NOTE — Telephone Encounter (Signed)
Requested Prescriptions   Signed Prescriptions Disp Refills   meloxicam (MOBIC) 15 MG tablet 30 tablet 0    Sig: TAKE 1 TABLET(15 MG) BY MOUTH DAILY FOR BACK PAIN    Authorizing Provider: [name]   Refill(s) sent to pharmacy.

## 2020-09-20 ENCOUNTER — Encounter: Payer: Self-pay | Admitting: Primary Care

## 2020-09-20 ENCOUNTER — Ambulatory Visit (INDEPENDENT_AMBULATORY_CARE_PROVIDER_SITE_OTHER): Payer: Managed Care, Other (non HMO)

## 2020-09-20 ENCOUNTER — Other Ambulatory Visit: Payer: Self-pay

## 2020-09-20 DIAGNOSIS — Z23 Encounter for immunization: Secondary | ICD-10-CM | POA: Diagnosis not present

## 2020-09-20 NOTE — Progress Notes (Signed)
Per orders of Alma Friendly, NP, injection of 2nd Shingrix given by Lurlean Nanny.  Patient tolerated injection well.

## 2021-04-03 ENCOUNTER — Encounter: Payer: Self-pay | Admitting: Internal Medicine

## 2021-04-08 ENCOUNTER — Other Ambulatory Visit: Payer: Self-pay | Admitting: Primary Care

## 2021-04-08 DIAGNOSIS — E785 Hyperlipidemia, unspecified: Secondary | ICD-10-CM

## 2021-04-08 DIAGNOSIS — I10 Essential (primary) hypertension: Secondary | ICD-10-CM

## 2021-05-15 ENCOUNTER — Other Ambulatory Visit: Payer: Managed Care, Other (non HMO)

## 2021-05-19 ENCOUNTER — Encounter: Payer: Managed Care, Other (non HMO) | Admitting: Primary Care

## 2021-05-23 ENCOUNTER — Other Ambulatory Visit: Payer: Self-pay

## 2021-05-23 ENCOUNTER — Ambulatory Visit (INDEPENDENT_AMBULATORY_CARE_PROVIDER_SITE_OTHER): Payer: Managed Care, Other (non HMO) | Admitting: Primary Care

## 2021-05-23 ENCOUNTER — Encounter: Payer: Self-pay | Admitting: Primary Care

## 2021-05-23 VITALS — BP 124/84 | HR 60 | Temp 97.6°F | Ht 71.0 in | Wt 225.0 lb

## 2021-05-23 DIAGNOSIS — I10 Essential (primary) hypertension: Secondary | ICD-10-CM

## 2021-05-23 DIAGNOSIS — R7303 Prediabetes: Secondary | ICD-10-CM

## 2021-05-23 DIAGNOSIS — Z0000 Encounter for general adult medical examination without abnormal findings: Secondary | ICD-10-CM | POA: Diagnosis not present

## 2021-05-23 DIAGNOSIS — Z125 Encounter for screening for malignant neoplasm of prostate: Secondary | ICD-10-CM | POA: Diagnosis not present

## 2021-05-23 DIAGNOSIS — N529 Male erectile dysfunction, unspecified: Secondary | ICD-10-CM

## 2021-05-23 DIAGNOSIS — Z1211 Encounter for screening for malignant neoplasm of colon: Secondary | ICD-10-CM | POA: Diagnosis not present

## 2021-05-23 DIAGNOSIS — Z23 Encounter for immunization: Secondary | ICD-10-CM

## 2021-05-23 DIAGNOSIS — E785 Hyperlipidemia, unspecified: Secondary | ICD-10-CM

## 2021-05-23 LAB — LIPID PANEL
Cholesterol: 132 mg/dL (ref 0–200)
HDL: 53.3 mg/dL (ref >39.00)
LDL Cholesterol: 66 mg/dL (ref 0–99)
NonHDL: 79.11
Total CHOL/HDL Ratio: 2
Triglycerides: 67 mg/dL (ref 0.0–149.0)
VLDL: 13.4 mg/dL (ref 0.0–40.0)

## 2021-05-23 LAB — COMPREHENSIVE METABOLIC PANEL
ALT: 30 U/L (ref 0–53)
AST: 21 U/L (ref 0–37)
Albumin: 4.3 g/dL (ref 3.5–5.2)
Alkaline Phosphatase: 67 U/L (ref 39–117)
BUN: 13 mg/dL (ref 6–23)
CO2: 30 mEq/L (ref 19–32)
Calcium: 9.4 mg/dL (ref 8.4–10.5)
Chloride: 102 mEq/L (ref 96–112)
Creatinine, Ser: 1 mg/dL (ref 0.40–1.50)
GFR: 81.12 mL/min (ref >60.00)
Glucose, Bld: 102 mg/dL — ABNORMAL HIGH (ref 70–99)
Potassium: 4.5 mEq/L (ref 3.5–5.1)
Sodium: 138 mEq/L (ref 135–145)
Total Bilirubin: 1.3 mg/dL — ABNORMAL HIGH (ref 0.2–1.2)
Total Protein: 6.9 g/dL (ref 6.0–8.3)

## 2021-05-23 LAB — HEMOGLOBIN A1C: Hgb A1c MFr Bld: 5.6 % (ref 4.6–6.5)

## 2021-05-23 LAB — CBC
HCT: 45.8 % (ref 39.0–52.0)
Hemoglobin: 15.5 g/dL (ref 13.0–17.0)
MCHC: 33.8 g/dL (ref 30.0–36.0)
MCV: 95.9 fl (ref 78.0–100.0)
Platelets: 247 10*3/uL (ref 150.0–400.0)
RBC: 4.78 Mil/uL (ref 4.22–5.81)
RDW: 13.2 % (ref 11.5–15.5)
WBC: 5.2 10*3/uL (ref 4.0–10.5)

## 2021-05-23 LAB — PSA: PSA: 1.23 ng/mL (ref 0.10–4.00)

## 2021-05-23 NOTE — Progress Notes (Signed)
Subjective:    Patient ID: Matthew Horne, male    DOB: Jun 12, 1961, 60 y.o.   MRN: [id number]  HPI  Matthew Horne is a very pleasant 60 y.o. male who presents today for complete physical and follow up of chronic conditions.  Immunizations: -Tetanus: 2013 -Influenza: Due today -Covid-19: 2 vaccines -Shingles: Shingrix completed  Diet: Fair diet.  Exercise: No regular exercise, active at home.   Eye exam: No recent exam.  Dental exam: Completes semi-annually   Colonoscopy: Completed in 2019, due 2022.  PSA: Due  BP Readings from Last 3 Encounters:  05/23/21 124/84  05/16/20 126/82  05/02/20 122/84       Review of Systems  Constitutional:  Negative for unexpected weight change.  HENT:  Negative for rhinorrhea.   Eyes:  Negative for visual disturbance.  Respiratory:  Negative for cough and shortness of breath.   Cardiovascular:  Negative for chest pain.  Gastrointestinal:  Negative for constipation and diarrhea.  Genitourinary:  Negative for difficulty urinating.  Musculoskeletal:  Negative for arthralgias and myalgias.  Skin:  Negative for rash.  Allergic/Immunologic: Negative for environmental allergies.  Neurological:  Negative for dizziness and headaches.  Psychiatric/Behavioral:  The patient is not nervous/anxious.         Past Medical History:  Diagnosis Date   Allergy    Contact lens/glasses fitting    wears contacts or glasses   DDD (degenerative disc disease)    Hyperlipidemia    Hypertension     Social History   Socioeconomic History   Marital status: Not on file    Spouse name: Not on file   Number of children: Not on file   Years of education: Not on file   Highest education level: Not on file  Occupational History   Not on file  Tobacco Use   Smoking status: Light Smoker    Types: Cigars   Smokeless tobacco: Never   Tobacco comments:    Cigar once in a while  Vaping Use   Vaping Use: Never used  Substance and Sexual Activity    Alcohol use: Yes    Alcohol/week: 2.0 - 3.0 standard drinks    Types: 2 - 3 Standard drinks or equivalent per week    Comment: social   Drug use: No   Sexual activity: Not on file    Comment: occ cigar  Other Topics Concern   Not on file  Social History Narrative   ** Merged History Encounter **       Married.  2 children. 3 grandchildren.  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    Social Determinants of Health   Financial Resource Strain: Not on file  Food Insecurity: Not on file  Transportation Needs: Not on file  Physical Activity: Not on file  Stress: Not on file  Social Connections: Not on file  Intimate Partner Violence: Not on file    Past Surgical History:  Procedure Laterality Date   BACK SURGERY  2010, 2003   lumb lam/diskectomy   COLONOSCOPY     KNEE ARTHROSCOPY Left 02/03/2013   Procedure: LEFT KNEE ARTHROSCOPY, PARTIAL MEDIAL MENISCECTOMY, CHONDROPLASTY;  Surgeon: Hessie Dibble, MD;  Location: [facility];  Service: Orthopedics;  Laterality: Left;   TONSILLECTOMY  1967    Family History  Problem Relation Age of Onset   Hyperlipidemia Mother    Hypertension Mother    Hyperlipidemia Father    Hypertension Father  Breast cancer Paternal Grandmother    Colon cancer Neg Hx    Esophageal cancer Neg Hx    Rectal cancer Neg Hx    Stomach cancer Neg Hx    Liver cancer Neg Hx    Pancreatic cancer Neg Hx     No Known Allergies  Current Outpatient Medications on File Prior to Visit  Medication Sig Dispense Refill   aspirin 81 MG tablet Take by mouth.     atorvastatin (LIPITOR) 20 MG tablet Take 1 tablet (20 mg total) by mouth daily. For cholesterol. Office visit required for further refills. 90 tablet 0   cholecalciferol (VITAMIN D) 1000 UNITS tablet Take 1,000 Units by mouth daily.      lisinopril (ZESTRIL) 10 MG tablet Take 1 tablet (10 mg total) by mouth daily. Office visit required for further  refills. 90 tablet 0   tadalafil (CIALIS) 5 MG tablet Take 1 tablet (5 mg total) by mouth daily as needed for erectile dysfunction. 30 tablet 0   meloxicam (MOBIC) 15 MG tablet TAKE 1 TABLET(15 MG) BY MOUTH DAILY FOR BACK PAIN (Patient not taking: Reported on 05/23/2021) 30 tablet 0   No current facility-administered medications on file prior to visit.    BP 124/84   Pulse 60   Temp 97.6 F (36.4 C) (Temporal)   Ht 5\' 11"  (1.803 m)   Wt 225 lb (102.1 kg)   SpO2 95%   BMI 31.38 kg/m  Objective:   Physical Exam HENT:     Right Ear: Tympanic membrane and ear canal normal.     Left Ear: Tympanic membrane and ear canal normal.     Nose: Nose normal.     Right Sinus: No maxillary sinus tenderness or frontal sinus tenderness.     Left Sinus: No maxillary sinus tenderness or frontal sinus tenderness.  Eyes:     Conjunctiva/sclera: Conjunctivae normal.  Neck:     Thyroid: No thyromegaly.     Vascular: No carotid bruit.  Cardiovascular:     Rate and Rhythm: Normal rate and regular rhythm.     Heart sounds: Normal heart sounds.  Pulmonary:     Effort: Pulmonary effort is normal.     Breath sounds: Normal breath sounds. No wheezing or rales.  Abdominal:     General: Bowel sounds are normal.     Palpations: Abdomen is soft.     Tenderness: There is no abdominal tenderness.  Musculoskeletal:        General: Normal range of motion.     Cervical back: Neck supple.  Skin:    General: Skin is warm and dry.  Neurological:     Mental Status: He is alert and oriented to person, place, and time.     Cranial Nerves: No cranial nerve deficit.     Deep Tendon Reflexes: Reflexes are normal and symmetric.  Psychiatric:        Mood and Affect: Mood normal.          Assessment & Plan:      This visit occurred during the SARS-CoV-2 public health emergency.  Safety protocols were in place, including screening questions prior to the visit, additional usage of staff PPE, and extensive  cleaning of exam room while observing appropriate contact time as indicated for disinfecting solutions.

## 2021-05-23 NOTE — Assessment & Plan Note (Signed)
Influenza vaccine due and provided. Other vaccines UTD.  PSA due and pending. Colonoscopy due, referral placed to GI.  Discussed the importance of a healthy diet and regular exercise in order for weight loss, and to reduce the risk of further co-morbidity.  Exam today stable. Labs pending.

## 2021-05-23 NOTE — Assessment & Plan Note (Signed)
Repeat lipid panel pending. Continue atorvastatin 20 mg.

## 2021-05-23 NOTE — Assessment & Plan Note (Signed)
Repeat A1C pending.  Discussed the importance of a healthy diet and regular exercise in order for weight loss, and to reduce the risk of further co-morbidity.  

## 2021-05-23 NOTE — Assessment & Plan Note (Signed)
Doing well on PRN tadalafil 5 mg. Continue same.

## 2021-05-23 NOTE — Assessment & Plan Note (Signed)
Controlled in the office today. Continue lisinopril 10 mg.  CMP pending.

## 2021-05-23 NOTE — Patient Instructions (Signed)
Stop by the lab prior to leaving today. I will notify you of your results once received.   You will be contacted regarding your referral to GI for the colonoscopy.  Please let us know if you have not been contacted within two weeks.   It was a pleasure to see you today!  Preventive Care 28-61 Years Old, Male Preventive care refers to lifestyle choices and visits with your health care provider that can promote health and wellness. This includes: A yearly physical exam. This is also called an annual wellness visit. Regular dental and eye exams. Immunizations. Screening for certain conditions. Healthy lifestyle choices, such as: Eating a healthy diet. Getting regular exercise. Not using drugs or products that contain nicotine and tobacco. Limiting alcohol use. What can I expect for my preventive care visit? Physical exam Your health care provider will check your: Height and weight. These may be used to calculate your BMI (body mass index). BMI is a measurement that tells if you are at a healthy weight. Heart rate and blood pressure. Body temperature. Skin for abnormal spots. Counseling Your health care provider may ask you questions about your: Past medical problems. Family's medical history. Alcohol, tobacco, and drug use. Emotional well-being. Home life and relationship well-being. Sexual activity. Diet, exercise, and sleep habits. Work and work Statistician. Access to firearms. What immunizations do I need? Vaccines are usually given at various ages, according to a schedule. Your health care provider will recommend vaccines for you based on your age, medical history, and lifestyle or other factors, such as travel or where you work. What tests do I need? Blood tests Lipid and cholesterol levels. These may be checked every 5 years, or more often if you are over 73 years old. Hepatitis C test. Hepatitis B test. Screening Lung cancer screening. You may have this screening every  year starting at age 2 if you have a 30-pack-year history of smoking and currently smoke or have quit within the past 15 years. Prostate cancer screening. Recommendations will vary depending on your family history and other risks. Genital exam to check for testicular cancer or hernias. Colorectal cancer screening. All adults should have this screening starting at age 1 and continuing until age 44. Your health care provider may recommend screening at age 32 if you are at increased risk. You will have tests every 1-10 years, depending on your results and the type of screening test. Diabetes screening. This is done by checking your blood sugar (glucose) after you have not eaten for a while (fasting). You may have this done every 1-3 years. STD (sexually transmitted disease) testing, if you are at risk. Follow these instructions at home: Eating and drinking  Eat a diet that includes fresh fruits and vegetables, whole grains, lean protein, and low-fat dairy products. Take vitamin and mineral supplements as recommended by your health care provider. Do not drink alcohol if your health care provider tells you not to drink. If you drink alcohol: Limit how much you have to 0-2 drinks a day. Be aware of how much alcohol is in your drink. In the [place] one drink equals one 12 oz bottle of beer (355 mL), one 5 oz glass of wine (148 mL), or one 1 oz glass of hard liquor (44 mL). Lifestyle Take daily care of your teeth and gums. Brush your teeth every morning and night with fluoride toothpaste. Floss one time each day. Stay active. Exercise for at least 30 minutes 5 or more days each week. Do  not use any products that contain nicotine or tobacco, such as cigarettes, e-cigarettes, and chewing tobacco. If you need help quitting, ask your health care provider. Do not use drugs. If you are sexually active, practice safe sex. Use a condom or other form of protection to prevent STIs (sexually transmitted  infections). If told by your health care provider, take low-dose aspirin daily starting at age 79. Find healthy ways to cope with stress, such as: Meditation, yoga, or listening to music. Journaling. Talking to a trusted person. Spending time with friends and family. Safety Always wear your seat belt while driving or riding in a vehicle. Do not drive: If you have been drinking alcohol. Do not ride with someone who has been drinking. When you are tired or distracted. While texting. Wear a helmet and other protective equipment during sports activities. If you have firearms in your house, make sure you follow all gun safety procedures. What's next? Go to your health care provider once a year for an annual wellness visit. Ask your health care provider how often you should have your eyes and teeth checked. Stay up to date on all vaccines. This information is not intended to replace advice given to you by your health care provider. Make sure you discuss any questions you have with your health care provider. Document Revised: 09/23/2020 Document Reviewed: 07/10/2018 Elsevier Patient Education  2022 [employer].

## 2021-05-28 ENCOUNTER — Other Ambulatory Visit: Payer: Self-pay

## 2021-05-28 DIAGNOSIS — N529 Male erectile dysfunction, unspecified: Secondary | ICD-10-CM

## 2021-05-30 MED ORDER — TADALAFIL 5 MG PO TABS: 5.0000 mg | ORAL_TABLET | Freq: Every day | ORAL | 0 refills | Status: DC | PRN

## 2021-07-01 ENCOUNTER — Other Ambulatory Visit: Payer: Self-pay | Admitting: Primary Care

## 2021-07-01 DIAGNOSIS — E785 Hyperlipidemia, unspecified: Secondary | ICD-10-CM

## 2021-07-01 DIAGNOSIS — I10 Essential (primary) hypertension: Secondary | ICD-10-CM

## 2021-08-02 ENCOUNTER — Encounter: Payer: Self-pay | Admitting: Internal Medicine

## 2021-08-25 ENCOUNTER — Other Ambulatory Visit: Payer: Self-pay

## 2021-08-25 ENCOUNTER — Ambulatory Visit (AMBULATORY_SURGERY_CENTER): Payer: Managed Care, Other (non HMO) | Admitting: *Deleted

## 2021-08-25 VITALS — Ht 71.0 in | Wt 220.0 lb

## 2021-08-25 DIAGNOSIS — Z86010 Personal history of colonic polyps: Secondary | ICD-10-CM

## 2021-08-25 MED ORDER — NA SULFATE-K SULFATE-MG SULF 17.5-3.13-1.6 GM/177ML PO SOLN: 1.0000 | Freq: Once | ORAL | 0 refills | Status: AC

## 2021-08-25 NOTE — Progress Notes (Signed)

## 2021-09-05 ENCOUNTER — Encounter: Payer: Self-pay | Admitting: Internal Medicine

## 2021-09-08 ENCOUNTER — Encounter: Payer: Self-pay | Admitting: Internal Medicine

## 2021-09-08 ENCOUNTER — Ambulatory Visit (AMBULATORY_SURGERY_CENTER): Payer: Managed Care, Other (non HMO) | Admitting: Internal Medicine

## 2021-09-08 VITALS — BP 106/61 | HR 54 | Temp 96.9°F | Resp 19 | Ht 71.0 in | Wt 220.0 lb

## 2021-09-08 DIAGNOSIS — K5140 Inflammatory polyps of colon without complications: Secondary | ICD-10-CM

## 2021-09-08 DIAGNOSIS — Z86010 Personal history of colonic polyps: Secondary | ICD-10-CM

## 2021-09-08 DIAGNOSIS — D125 Benign neoplasm of sigmoid colon: Secondary | ICD-10-CM

## 2021-09-08 DIAGNOSIS — K635 Polyp of colon: Secondary | ICD-10-CM

## 2021-09-08 DIAGNOSIS — D123 Benign neoplasm of transverse colon: Secondary | ICD-10-CM

## 2021-09-08 DIAGNOSIS — D120 Benign neoplasm of cecum: Secondary | ICD-10-CM

## 2021-09-08 MED ORDER — SODIUM CHLORIDE 0.9 % IV SOLN: 500.0000 mL | Freq: Once | INTRAVENOUS | Status: DC

## 2021-09-08 NOTE — Progress Notes (Signed)
Sedate, gd SR, tolerated procedure well, VSS, report to RN 

## 2021-09-08 NOTE — Progress Notes (Signed)
Called to room to assist during endoscopic procedure.  Patient ID and intended procedure confirmed with present staff. Received instructions for my participation in the procedure from the performing physician.  

## 2021-09-08 NOTE — Progress Notes (Signed)
No problems noted in the recovery room. maw 

## 2021-09-08 NOTE — Patient Instructions (Addendum)
Handouts were given to your care partner on polyps and hemorrhoids. Resume your present medications today. Await biopsy results.  May take 1-3 weeks to receive pathology results. Please call if any questions or concerns.      YOU HAD AN ENDOSCOPIC PROCEDURE TODAY AT [facility]:   Refer to the procedure report that was given to you for any specific questions about what was found during the examination.  If the procedure report does not answer your questions, please call your gastroenterologist to clarify.  If you requested that your care partner not be given the details of your procedure findings, then the procedure report has been included in a sealed envelope for you to review at your convenience later.  YOU SHOULD EXPECT: Some feelings of bloating in the abdomen. Passage of more gas than usual.  Walking can help get rid of the air that was put into your GI tract during the procedure and reduce the bloating. If you had a lower endoscopy (such as a colonoscopy or flexible sigmoidoscopy) you may notice spotting of blood in your stool or on the toilet paper. If you underwent a bowel prep for your procedure, you may not have a normal bowel movement for a few days.  Please Note:  You might notice some irritation and congestion in your nose or some drainage.  This is from the oxygen used during your procedure.  There is no need for concern and it should clear up in a day or so.  SYMPTOMS TO REPORT IMMEDIATELY:  Following lower endoscopy (colonoscopy or flexible sigmoidoscopy):  Excessive amounts of blood in the stool  Significant tenderness or worsening of abdominal pains  Swelling of the abdomen that is new, acute  Fever of 100F or higher   For urgent or emergent issues, a gastroenterologist can be reached at any hour by calling [phone]. Do not use MyChart messaging for urgent concerns.    DIET:  We do recommend a small meal at first, but then you may proceed to your  regular diet.  Drink plenty of fluids but you should avoid alcoholic beverages for 24 hours.  ACTIVITY:  You should plan to take it easy for the rest of today and you should NOT DRIVE or use heavy machinery until tomorrow (because of the sedation medicines used during the test).    FOLLOW UP: Our staff will call the number listed on your records 48-72 hours following your procedure to check on you and address any questions or concerns that you may have regarding the information given to you following your procedure. If we do not reach you, we will leave a message.  We will attempt to reach you two times.  During this call, we will ask if you have developed any symptoms of COVID 19. If you develop any symptoms (ie: fever, flu-like symptoms, shortness of breath, cough etc.) before then, please call [phone].  If you test positive for Covid 19 in the 2 weeks post procedure, please call and report this information to [place].    If any biopsies were taken you will be contacted by phone or by letter within the next 1-3 weeks.  Please call us at [phone] if you have not heard about the biopsies in 3 weeks.    SIGNATURES/CONFIDENTIALITY: You and/or your care partner have signed paperwork which will be entered into your electronic medical record.  These signatures attest to the fact that that the information above on your After Visit Summary has  been reviewed and is understood.  Full responsibility of the confidentiality of this discharge information lies with you and/or your care-partner.

## 2021-09-08 NOTE — Op Note (Signed)
[facility] Patient Name: Matthew Horne Procedure Date: 09/08/2021 1:32 PM MRN: [id number] Endoscopist: Jerene Bears , MD Age: 61 Referring MD:  Date of Birth: Sep 10, 1960 Gender: Male Account #: [id number] Procedure:                Colonoscopy Indications:              High risk colon cancer surveillance: Personal                            history of multiple adenomas and SSPs (including                            those > 1 cm), Last colonoscopy: July 2019 Medicines:                Monitored Anesthesia Care Procedure:                Pre-Anesthesia Assessment:                           - Prior to the procedure, a History and Physical                            was performed, and patient medications and                            allergies were reviewed. The patient's tolerance of                            previous anesthesia was also reviewed. The risks                            and benefits of the procedure and the sedation                            options and risks were discussed with the patient.                            All questions were answered, and informed consent                            was obtained. Prior Anticoagulants: The patient has                            taken no previous anticoagulant or antiplatelet                            agents. ASA Grade Assessment: II - A patient with                            mild systemic disease. After reviewing the risks                            and benefits, the patient was deemed in  satisfactory condition to undergo the procedure.                           After obtaining informed consent, the colonoscope                            was passed under direct vision. Throughout the                            procedure, the patient's blood pressure, pulse, and                            oxygen saturations were monitored continuously. The                            Colonoscope was introduced  through the anus and                            advanced to the cecum, identified by appendiceal                            orifice and ileocecal valve. The colonoscopy was                            performed without difficulty. The patient tolerated                            the procedure well. The quality of the bowel                            preparation was good. The ileocecal valve,                            appendiceal orifice, and rectum were photographed. Scope In: 1:38:59 PM Scope Out: 1:58:10 PM Scope Withdrawal Time: 0 hours 14 minutes 54 seconds  Total Procedure Duration: 0 hours 19 minutes 11 seconds  Findings:                 The digital rectal exam was normal.                           A 5 mm polyp was found in the cecum. The polyp was                            sessile. The polyp was removed with a cold snare.                            Resection and retrieval were complete.                           A 5 mm polyp was found in the transverse colon. The                            polyp was sessile.  The polyp was removed with a                            cold snare. Resection and retrieval were complete.                           Two sessile polyps were found in the descending                            colon. The polyps were 4 to 5 mm in size. These                            polyps were removed with a cold snare. Resection                            and retrieval were complete.                           A 6 mm polyp was found in the sigmoid colon. The                            polyp was sessile. The polyp was removed with a                            cold snare. Resection and retrieval were complete.                           Internal hemorrhoids were found during                            retroflexion. The hemorrhoids were small. Complications:            No immediate complications. Estimated Blood Loss:     Estimated blood loss was minimal. Impression:                - One 5 mm polyp in the cecum, removed with a cold                            snare. Resected and retrieved.                           - One 5 mm polyp in the transverse colon, removed                            with a cold snare. Resected and retrieved.                           - Two 4 to 5 mm polyps in the descending colon,                            removed with a cold snare. Resected and retrieved.                           -  One 6 mm polyp in the sigmoid colon, removed with                            a cold snare. Resected and retrieved.                           - Internal hemorrhoids. Recommendation:           - Patient has a contact number available for                            emergencies. The signs and symptoms of potential                            delayed complications were discussed with the                            patient. Return to normal activities tomorrow.                            Written discharge instructions were provided to the                            patient.                           - Resume previous diet.                           - Continue present medications.                           - Await pathology results.                           - Repeat colonoscopy is recommended for                            surveillance. The colonoscopy date will be                            determined after pathology results from today's                            exam become available for review. Jerene Bears, MD 09/08/2021 2:01:23 PM This report has been signed electronically.

## 2021-09-08 NOTE — Progress Notes (Signed)
GASTROENTEROLOGY PROCEDURE H&P NOTE   Primary Care Physician: [name], NP    Reason for Procedure:  History of multiple adenomatous and sessile serrated colon polyps  Plan:    Colonoscopy  Patient is appropriate for endoscopic procedure(s) in the ambulatory ([place]) setting.  The nature of the procedure, as well as the risks, benefits, and alternatives were carefully and thoroughly reviewed with the patient. Ample time for discussion and questions allowed. The patient understood, was satisfied, and agreed to proceed.     HPI: Matthew Horne is a 61 y.o. male who presents for surveillance colonoscopy.  Medical history as below.  Tolerated the prep.  No recent chest pain or shortness of breath.  No abdominal pain today.  Past Medical History:  Diagnosis Date   Allergy    Contact lens/glasses fitting    wears contacts or glasses   DDD (degenerative disc disease)    Hyperlipidemia    Hypertension     Past Surgical History:  Procedure Laterality Date   BACK SURGERY  2010, 2003   lumb lam/diskectomy   COLONOSCOPY     KNEE ARTHROSCOPY Left 02/03/2013   Procedure: LEFT KNEE ARTHROSCOPY, PARTIAL MEDIAL MENISCECTOMY, CHONDROPLASTY;  Surgeon: Hessie Dibble, MD;  Location: [facility];  Service: Orthopedics;  Laterality: Left;   POLYPECTOMY     TONSILLECTOMY  07/30/1965    Prior to Admission medications   Medication Sig Start Date End Date Taking? Authorizing Provider  aspirin 81 MG tablet Take by mouth.   Yes [provider]  atorvastatin (LIPITOR) 20 MG tablet Take 1 tablet (20 mg total) by mouth daily. For cholesterol. 07/02/21  Yes [name], NP  cholecalciferol (VITAMIN D) 1000 UNITS tablet Take 1,000 Units by mouth daily.    Yes [provider]  lisinopril (ZESTRIL) 10 MG tablet Take 1 tablet (10 mg total) by mouth daily. For blood pressure. 07/02/21  Yes [name], NP  Multiple Vitamins-Minerals (MULTIVITAMIN  ADULTS 50+ PO) Take by mouth daily.   Yes [provider]  tadalafil (CIALIS) 5 MG tablet Take 1 tablet (5 mg total) by mouth daily as needed for erectile dysfunction. 05/30/21   [name], NP    Current Outpatient Medications  Medication Sig Dispense Refill   aspirin 81 MG tablet Take by mouth.     atorvastatin (LIPITOR) 20 MG tablet Take 1 tablet (20 mg total) by mouth daily. For cholesterol. 90 tablet 2   cholecalciferol (VITAMIN D) 1000 UNITS tablet Take 1,000 Units by mouth daily.      lisinopril (ZESTRIL) 10 MG tablet Take 1 tablet (10 mg total) by mouth daily. For blood pressure. 90 tablet 2   Multiple Vitamins-Minerals (MULTIVITAMIN ADULTS 50+ PO) Take by mouth daily.     tadalafil (CIALIS) 5 MG tablet Take 1 tablet (5 mg total) by mouth daily as needed for erectile dysfunction. 30 tablet 0   Current Facility-Administered Medications  Medication Dose Route Frequency Provider Last Rate Last Admin   0.9 %  sodium chloride infusion  500 mL Intravenous Once Abrea Henle, Lajuan Lines, MD        Allergies as of 09/08/2021   (No Known Allergies)    Family History  Problem Relation Age of Onset   Hyperlipidemia Mother    Hypertension Mother    Hyperlipidemia Father    Hypertension Father    Breast cancer Paternal Grandmother    Colon cancer Neg Hx    Esophageal cancer Neg Hx  Rectal cancer Neg Hx    Stomach cancer Neg Hx    Liver cancer Neg Hx    Pancreatic cancer Neg Hx    Colon polyps Neg Hx     Social History   Socioeconomic History   Marital status: Married    Spouse name: Not on file   Number of children: Not on file   Years of education: Not on file   Highest education level: Not on file  Occupational History   Not on file  Tobacco Use   Smoking status: Light Smoker    Types: Cigars   Smokeless tobacco: Never   Tobacco comments:    Cigar once in a while  Vaping Use   Vaping Use: Never used  Substance and Sexual Activity   Alcohol use: Yes     Alcohol/week: 2.0 - 3.0 standard drinks    Types: 2 - 3 Standard drinks or equivalent per week    Comment: social   Drug use: No   Sexual activity: Not on file    Comment: occ cigar  Other Topics Concern   Not on file  Social History Narrative   ** Merged History Encounter **       Married.  2 children. 3 grandchildren.  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    Social Determinants of Health   Financial Resource Strain: Not on file  Food Insecurity: Not on file  Transportation Needs: Not on file  Physical Activity: Not on file  Stress: Not on file  Social Connections: Not on file  Intimate Partner Violence: Not on file    Physical Exam: Vital signs in last 24 hours: @BP  121/64    Pulse 65    Temp (!) 96.9 F (36.1 C) (Temporal)    Ht 5\' 11"  (1.803 m)    Wt 220 lb (99.8 kg)    SpO2 96%    BMI 30.68 kg/m  GEN: NAD EYE: Sclerae anicteric ENT: MMM CV: Non-tachycardic Pulm: CTA b/l GI: Soft, NT/ND NEURO:  Alert & Oriented x 3   Zenovia Jarred, MD [facility]  09/08/2021 1:30 PM

## 2021-09-08 NOTE — Progress Notes (Signed)
Pt's states no medical or surgical changes since previsit or office visit.  ° °VS DT °

## 2021-09-12 ENCOUNTER — Telehealth: Payer: Self-pay

## 2021-09-12 NOTE — Telephone Encounter (Signed)
°  Follow up Call-  Call back number 09/08/2021  Post procedure Call Back phone  # [phone]  Permission to leave phone message Yes  Some recent data might be hidden     Patient questions:  Do you have a fever, pain , or abdominal swelling? No. Pain Score  0 *  Have you tolerated food without any problems? Yes.    Have you been able to return to your normal activities? Yes.    Do you have any questions about your discharge instructions: Diet   No. Medications  No. Follow up visit  No.  Do you have questions or concerns about your Care? No.  Actions: * If pain score is 4 or above: No action needed, pain <4.

## 2021-09-14 ENCOUNTER — Encounter: Payer: Self-pay | Admitting: Internal Medicine

## 2022-03-06 ENCOUNTER — Other Ambulatory Visit: Payer: Self-pay | Admitting: Primary Care

## 2022-03-06 DIAGNOSIS — I10 Essential (primary) hypertension: Secondary | ICD-10-CM

## 2022-03-06 DIAGNOSIS — E785 Hyperlipidemia, unspecified: Secondary | ICD-10-CM

## 2022-03-07 NOTE — Telephone Encounter (Signed)
Patient is due for CPE/follow up in late October, this will be required prior to any further refills.   Please schedule.

## 2022-03-14 ENCOUNTER — Other Ambulatory Visit: Payer: Self-pay | Admitting: Primary Care

## 2022-03-14 DIAGNOSIS — N529 Male erectile dysfunction, unspecified: Secondary | ICD-10-CM

## 2022-03-14 MED ORDER — TADALAFIL 5 MG PO TABS: 5.0000 mg | ORAL_TABLET | Freq: Every day | ORAL | 0 refills | Status: DC | PRN

## 2022-03-14 NOTE — Telephone Encounter (Signed)
Patient scheduled.

## 2022-03-14 NOTE — Telephone Encounter (Signed)
Patient due for CPE/follow up in late October/early November. Please schedule.

## 2022-05-30 ENCOUNTER — Encounter: Payer: Self-pay | Admitting: Primary Care

## 2022-05-30 ENCOUNTER — Ambulatory Visit (INDEPENDENT_AMBULATORY_CARE_PROVIDER_SITE_OTHER): Payer: Managed Care, Other (non HMO) | Admitting: Primary Care

## 2022-05-30 VITALS — BP 120/78 | HR 66 | Temp 97.0°F | Ht 71.0 in | Wt 221.0 lb

## 2022-05-30 DIAGNOSIS — Z23 Encounter for immunization: Secondary | ICD-10-CM

## 2022-05-30 DIAGNOSIS — E785 Hyperlipidemia, unspecified: Secondary | ICD-10-CM

## 2022-05-30 DIAGNOSIS — Z0000 Encounter for general adult medical examination without abnormal findings: Secondary | ICD-10-CM

## 2022-05-30 DIAGNOSIS — R7303 Prediabetes: Secondary | ICD-10-CM

## 2022-05-30 DIAGNOSIS — I10 Essential (primary) hypertension: Secondary | ICD-10-CM

## 2022-05-30 DIAGNOSIS — N529 Male erectile dysfunction, unspecified: Secondary | ICD-10-CM

## 2022-05-30 DIAGNOSIS — Z125 Encounter for screening for malignant neoplasm of prostate: Secondary | ICD-10-CM

## 2022-05-30 LAB — COMPREHENSIVE METABOLIC PANEL
ALT: 27 U/L (ref 0–53)
AST: 17 U/L (ref 0–37)
Albumin: 4.4 g/dL (ref 3.5–5.2)
Alkaline Phosphatase: 61 U/L (ref 39–117)
BUN: 18 mg/dL (ref 6–23)
CO2: 33 mEq/L — ABNORMAL HIGH (ref 19–32)
Calcium: 9.6 mg/dL (ref 8.4–10.5)
Chloride: 101 mEq/L (ref 96–112)
Creatinine, Ser: 1.07 mg/dL (ref 0.40–1.50)
GFR: 74.26 mL/min (ref >60.00)
Glucose, Bld: 104 mg/dL — ABNORMAL HIGH (ref 70–99)
Potassium: 4.4 mEq/L (ref 3.5–5.1)
Sodium: 138 mEq/L (ref 135–145)
Total Bilirubin: 1.7 mg/dL — ABNORMAL HIGH (ref 0.2–1.2)
Total Protein: 7 g/dL (ref 6.0–8.3)

## 2022-05-30 LAB — PSA: PSA: 0.82 ng/mL (ref 0.10–4.00)

## 2022-05-30 LAB — LIPID PANEL
Cholesterol: 133 mg/dL (ref 0–200)
HDL: 46.9 mg/dL (ref >39.00)
LDL Cholesterol: 64 mg/dL (ref 0–99)
NonHDL: 85.67
Total CHOL/HDL Ratio: 3
Triglycerides: 107 mg/dL (ref 0.0–149.0)
VLDL: 21.4 mg/dL (ref 0.0–40.0)

## 2022-05-30 LAB — HEMOGLOBIN A1C: Hgb A1c MFr Bld: 5.7 % (ref 4.6–6.5)

## 2022-05-30 NOTE — Assessment & Plan Note (Signed)
Controlled.   Continue lisinopril 10 mg daily.  CMP pending.

## 2022-05-30 NOTE — Assessment & Plan Note (Signed)
Repeat A1C pending. 

## 2022-05-30 NOTE — Assessment & Plan Note (Signed)
Controlled.  Continue Cialis 5 mg PRN.

## 2022-05-30 NOTE — Patient Instructions (Signed)
Stop by the lab prior to leaving today. I will notify you of your results once received.   It was a pleasure to see you today!  Preventive Care 62-62 Years Old, Male Preventive care refers to lifestyle choices and visits with your health care provider that can promote health and wellness. Preventive care visits are also called wellness exams. What can I expect for my preventive care visit? Counseling During your preventive care visit, your health care provider may ask about your: Medical history, including: Past medical problems. Family medical history. Current health, including: Emotional well-being. Home life and relationship well-being. Sexual activity. Lifestyle, including: Alcohol, nicotine or tobacco, and drug use. Access to firearms. Diet, exercise, and sleep habits. Safety issues such as seatbelt and bike helmet use. Sunscreen use. Work and work Statistician. Physical exam Your health care provider will check your: Height and weight. These may be used to calculate your BMI (body mass index). BMI is a measurement that tells if you are at a healthy weight. Waist circumference. This measures the distance around your waistline. This measurement also tells if you are at a healthy weight and may help predict your risk of certain diseases, such as type 2 diabetes and high blood pressure. Heart rate and blood pressure. Body temperature. Skin for abnormal spots. What immunizations do I need?  Vaccines are usually given at various ages, according to a schedule. Your health care provider will recommend vaccines for you based on your age, medical history, and lifestyle or other factors, such as travel or where you work. What tests do I need? Screening Your health care provider may recommend screening tests for certain conditions. This may include: Lipid and cholesterol levels. Diabetes screening. This is done by checking your blood sugar (glucose) after you have not eaten for a while  (fasting). Hepatitis B test. Hepatitis C test. HIV (human immunodeficiency virus) test. STI (sexually transmitted infection) testing, if you are at risk. Lung cancer screening. Prostate cancer screening. Colorectal cancer screening. Talk with your health care provider about your test results, treatment options, and if necessary, the need for more tests. Follow these instructions at home: Eating and drinking  Eat a diet that includes fresh fruits and vegetables, whole grains, lean protein, and low-fat dairy products. Take vitamin and mineral supplements as recommended by your health care provider. Do not drink alcohol if your health care provider tells you not to drink. If you drink alcohol: Limit how much you have to 0-2 drinks a day. Know how much alcohol is in your drink. In the [place] one drink equals one 12 oz bottle of beer (355 mL), one 5 oz glass of wine (148 mL), or one 1 oz glass of hard liquor (44 mL). Lifestyle Brush your teeth every morning and night with fluoride toothpaste. Floss one time each day. Exercise for at least 30 minutes 5 or more days each week. Do not use any products that contain nicotine or tobacco. These products include cigarettes, chewing tobacco, and vaping devices, such as e-cigarettes. If you need help quitting, ask your health care provider. Do not use drugs. If you are sexually active, practice safe sex. Use a condom or other form of protection to prevent STIs. Take aspirin only as told by your health care provider. Make sure that you understand how much to take and what form to take. Work with your health care provider to find out whether it is safe and beneficial for you to take aspirin daily. Find healthy ways to manage  stress, such as: Meditation, yoga, or listening to music. Journaling. Talking to a trusted person. Spending time with friends and family. Minimize exposure to UV radiation to reduce your risk of skin cancer. Safety Always wear  your seat belt while driving or riding in a vehicle. Do not drive: If you have been drinking alcohol. Do not ride with someone who has been drinking. When you are tired or distracted. While texting. If you have been using any mind-altering substances or drugs. Wear a helmet and other protective equipment during sports activities. If you have firearms in your house, make sure you follow all gun safety procedures. What's next? Go to your health care provider once a year for an annual wellness visit. Ask your health care provider how often you should have your eyes and teeth checked. Stay up to date on all vaccines. This information is not intended to replace advice given to you by your health care provider. Make sure you discuss any questions you have with your health care provider. Document Revised: 01/11/2021 Document Reviewed: 01/11/2021 Elsevier Patient Education  [place].

## 2022-05-30 NOTE — Addendum Note (Signed)
Addended by: Pat Kocher on: 05/30/2022 09:22 AM   Modules accepted: Orders

## 2022-05-30 NOTE — Assessment & Plan Note (Signed)
Continue atorvastatin 20 mg daily. Repeat lipid panel pending. 

## 2022-05-30 NOTE — Assessment & Plan Note (Signed)
Tetanus and influenza vaccines due, provided today. PSA due and pending. Colonoscopy UTD, due 2026.  Discussed the importance of a healthy diet and regular exercise in order for weight loss, and to reduce the risk of further co-morbidity.  Exam stable. Labs pending.  Follow up in 1 year for repeat physical.

## 2022-05-30 NOTE — Progress Notes (Signed)
Subjective:    Patient ID: Matthew Horne, male    DOB: [birth year], 61 y.o.   MRN: [id number]  HPI  Matthew Horne is a very pleasant 61 y.o. male who presents today for complete physical and follow up of chronic conditions.  Immunizations: -Tetanus: 2013 -Influenza: Due today -Shingles: Completed Shingrix   Diet: Fair diet.  Exercise: No regular exercise. 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  Eye exam: Completes annually  Dental exam: Completes semi-annually   Colonoscopy: Completed in 2023, due 2026  PSA: Due   BP Readings from Last 3 Encounters:  05/30/22 120/78  09/08/21 106/61  05/23/21 124/84        Review of Systems  Constitutional:  Negative for unexpected weight change.  HENT:  Negative for rhinorrhea.   Respiratory:  Negative for cough and shortness of breath.   Cardiovascular:  Negative for chest pain.  Gastrointestinal:  Negative for constipation and diarrhea.  Genitourinary:  Negative for difficulty urinating.  Musculoskeletal:  Positive for arthralgias.  Skin:  Negative for rash.  Allergic/Immunologic: Negative for environmental allergies.  Neurological:  Negative for dizziness and headaches.  Psychiatric/Behavioral:  The patient is not nervous/anxious.          Past Medical History:  Diagnosis Date   Allergy    Contact lens/glasses fitting    wears contacts or glasses   DDD (degenerative disc disease)    Hyperlipidemia    Hypertension     Social History   Socioeconomic History   Marital status: Married    Spouse name: Not on file   Number of children: Not on file   Years of education: Not on file   Highest education level: Not on file  Occupational History   Not on file  Tobacco Use   Smoking status: Light Smoker    Types: Cigars   Smokeless tobacco: Never   Tobacco comments:    Cigar once in a while  Vaping Use   Vaping Use: Never used  Substance and Sexual Activity   Alcohol use: Yes    Alcohol/week: 2.0 - 3.0 standard  drinks of alcohol    Types: 2 - 3 Standard drinks or equivalent per week    Comment: social   Drug use: No   Sexual activity: Not on file    Comment: occ cigar  Other Topics Concern   Not on file  Social History Narrative   ** Merged History Encounter **       Married.  2 children. 3 grandchildren.  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    Social Determinants of Health   Financial Resource Strain: Not on file  Food Insecurity: Not on file  Transportation Needs: Not on file  Physical Activity: Not on file  Stress: Not on file  Social Connections: Not on file  Intimate Partner Violence: Not on file    Past Surgical History:  Procedure Laterality Date   BACK SURGERY  2010, 2003   lumb lam/diskectomy   COLONOSCOPY     KNEE ARTHROSCOPY Left 02/03/2013   Procedure: LEFT KNEE ARTHROSCOPY, PARTIAL MEDIAL MENISCECTOMY, CHONDROPLASTY;  Surgeon: Hessie Dibble, MD;  Location: [facility];  Service: Orthopedics;  Laterality: Left;   POLYPECTOMY     TONSILLECTOMY  07/30/1965    Family History  Problem Relation Age of Onset   Hyperlipidemia Mother    Hypertension Mother    Hyperlipidemia Father    Hypertension Father  Breast cancer Paternal Grandmother    Colon cancer Neg Hx    Esophageal cancer Neg Hx    Rectal cancer Neg Hx    Stomach cancer Neg Hx    Liver cancer Neg Hx    Pancreatic cancer Neg Hx    Colon polyps Neg Hx     No Known Allergies  Current Outpatient Medications on File Prior to Visit  Medication Sig Dispense Refill   aspirin 81 MG tablet Take by mouth.     atorvastatin (LIPITOR) 20 MG tablet Take 1 tablet (20 mg total) by mouth daily. for cholesterol. Office visit required for further refills. 90 tablet 0   cholecalciferol (VITAMIN D) 1000 UNITS tablet Take 1,000 Units by mouth daily.      lisinopril (ZESTRIL) 10 MG tablet Take 1 tablet (10 mg total) by mouth daily. for blood pressure. Office  visit required for further refills. 90 tablet 0   Multiple Vitamins-Minerals (MULTIVITAMIN ADULTS 50+ PO) Take by mouth daily.     tadalafil (CIALIS) 5 MG tablet Take 1 tablet (5 mg total) by mouth daily as needed for erectile dysfunction. 30 tablet 0   No current facility-administered medications on file prior to visit.    BP 120/78   Pulse 66   Temp (!) 97 F (36.1 C) (Temporal)   Ht '5\' 11"'$  (1.803 m)   Wt 221 lb (100.2 kg)   SpO2 97%   BMI 30.82 kg/m  Objective:   Physical Exam HENT:     Right Ear: Tympanic membrane and ear canal normal.     Left Ear: Tympanic membrane and ear canal normal.     Nose: Nose normal.     Right Sinus: No maxillary sinus tenderness or frontal sinus tenderness.     Left Sinus: No maxillary sinus tenderness or frontal sinus tenderness.  Eyes:     Conjunctiva/sclera: Conjunctivae normal.  Neck:     Thyroid: No thyromegaly.     Vascular: No carotid bruit.  Cardiovascular:     Rate and Rhythm: Normal rate and regular rhythm.     Heart sounds: Normal heart sounds.  Pulmonary:     Effort: Pulmonary effort is normal.     Breath sounds: Normal breath sounds. No wheezing or rales.  Abdominal:     General: Bowel sounds are normal.     Palpations: Abdomen is soft.     Tenderness: There is no abdominal tenderness.  Musculoskeletal:        General: Normal range of motion.     Cervical back: Neck supple.  Skin:    General: Skin is warm and dry.  Neurological:     Mental Status: He is alert and oriented to person, place, and time.     Cranial Nerves: No cranial nerve deficit.     Deep Tendon Reflexes: Reflexes are normal and symmetric.  Psychiatric:        Mood and Affect: Mood normal.           Assessment & Plan:   Problem List Items Addressed This Visit       Cardiovascular and Mediastinum   Hypertension    Controlled.   Continue lisinopril 10 mg daily.  CMP pending.         Other   Hyperlipidemia with target LDL less than 100     Continue atorvastatin 20 mg daily. Repeat lipid panel pending.       Relevant Orders   Lipid panel   Comprehensive metabolic panel  Erectile dysfunction    Controlled.  Continue Cialis 5 mg PRN.      Prediabetes    Repeat A1C pending.      Relevant Orders   Hemoglobin A1c   Preventative health care - Primary    Tetanus and influenza vaccines due, provided today. PSA due and pending. Colonoscopy UTD, due 2026.  Discussed the importance of a healthy diet and regular exercise in order for weight loss, and to reduce the risk of further co-morbidity.  Exam stable. Labs pending.  Follow up in 1 year for repeat physical.       Other Visit Diagnoses     Screening for prostate cancer       Relevant Orders   PSA          [name], NP

## 2022-05-31 ENCOUNTER — Telehealth: Payer: Self-pay

## 2022-05-31 DIAGNOSIS — E785 Hyperlipidemia, unspecified: Secondary | ICD-10-CM

## 2022-05-31 DIAGNOSIS — I10 Essential (primary) hypertension: Secondary | ICD-10-CM

## 2022-05-31 MED ORDER — LISINOPRIL 10 MG PO TABS: 10.0000 mg | ORAL_TABLET | Freq: Every day | ORAL | 3 refills | Status: DC

## 2022-05-31 MED ORDER — ATORVASTATIN CALCIUM 20 MG PO TABS: 20.0000 mg | ORAL_TABLET | Freq: Every day | ORAL | 3 refills | Status: DC

## 2022-05-31 NOTE — Telephone Encounter (Signed)
Refills sent to pharmacy. 

## 2022-06-19 ENCOUNTER — Other Ambulatory Visit: Payer: Self-pay | Admitting: Primary Care

## 2022-06-19 DIAGNOSIS — N529 Male erectile dysfunction, unspecified: Secondary | ICD-10-CM

## 2022-06-19 MED ORDER — TADALAFIL 5 MG PO TABS: 5.0000 mg | ORAL_TABLET | Freq: Every day | ORAL | 0 refills | Status: DC | PRN

## 2022-06-19 NOTE — Telephone Encounter (Signed)
From: Benedetto Coons To: Office of [name], NP Sent: 06/19/2022 11:57 AM EST Subject: Medication Renewal Request  Refills have been requested for the following medications:   tadalafil (CIALIS) 5 MG tablet [Darothy Courtright K Lea Baine]  Preferred pharmacy: [facility], [place] Delivery method: Pickup

## 2022-08-01 ENCOUNTER — Telehealth: Payer: Self-pay | Admitting: Primary Care

## 2022-08-01 DIAGNOSIS — I10 Essential (primary) hypertension: Secondary | ICD-10-CM

## 2022-08-01 DIAGNOSIS — E785 Hyperlipidemia, unspecified: Secondary | ICD-10-CM

## 2022-08-01 NOTE — Telephone Encounter (Signed)
Caller Name: Patricio Call back phone #: [phone]  MEDICATION(S):  atorvastatin (LIPITOR) 20 MG tablet  lisinopril (ZESTRIL) 10 MG tablet   Days of Med Remaining: 0  Has the patient contacted their pharmacy (YES/NO)? YES What did pharmacy advise?  Pharmacy stated the refills were authorized by pcp & stated pt needed to contact [facility]   Preferred Pharmacy:  [facility] [place], [place]   ~~~Please advise patient/caregiver to allow 2-3 business days to process RX refills.

## 2022-08-01 NOTE — Telephone Encounter (Signed)
Notified patient that Anda Kraft sent in a one year supply of both atorvastatin and lisinopril to [facility] on 05/31/22. Advised to contact them and not to give them his old prescription number as a new one was sent in for these prescriptions.

## 2022-08-09 MED ORDER — ATORVASTATIN CALCIUM 20 MG PO TABS: 20.0000 mg | ORAL_TABLET | Freq: Every day | ORAL | 2 refills | Status: DC

## 2022-08-09 MED ORDER — LISINOPRIL 10 MG PO TABS: 10.0000 mg | ORAL_TABLET | Freq: Every day | ORAL | 2 refills | Status: DC

## 2022-08-09 NOTE — Telephone Encounter (Signed)
Patient returned call, he is no longer with [facility], [facility]  Please send the atorvastatin and lisinopril to the following pharmacy:  [place], [place] Phone: [phone]  Fax: [phone]     Patient has 2 weeks left  RX Bin# [id number] RX PCN: [id number] GRP: [id number] ID# [id number]  Patient will bring a copy of RX card at next visit to scan into media  Patient states if any questions he can be reached via My chart or [phone]

## 2022-08-09 NOTE — Addendum Note (Signed)
Addended by: [name] on: 08/09/2022 04:14 PM   Modules accepted: Orders

## 2022-08-09 NOTE — Telephone Encounter (Signed)
Refills sent to pharmacy. 

## 2023-06-04 ENCOUNTER — Encounter: Payer: Self-pay | Admitting: Primary Care

## 2023-06-04 ENCOUNTER — Ambulatory Visit (INDEPENDENT_AMBULATORY_CARE_PROVIDER_SITE_OTHER): Payer: Managed Care, Other (non HMO) | Admitting: Primary Care

## 2023-06-04 VITALS — BP 124/72 | HR 62 | Temp 97.5°F | Ht 70.87 in | Wt 230.2 lb

## 2023-06-04 DIAGNOSIS — Z23 Encounter for immunization: Secondary | ICD-10-CM | POA: Diagnosis not present

## 2023-06-04 DIAGNOSIS — R051 Acute cough: Secondary | ICD-10-CM

## 2023-06-04 DIAGNOSIS — Z0001 Encounter for general adult medical examination with abnormal findings: Secondary | ICD-10-CM | POA: Diagnosis not present

## 2023-06-04 DIAGNOSIS — E785 Hyperlipidemia, unspecified: Secondary | ICD-10-CM

## 2023-06-04 DIAGNOSIS — I10 Essential (primary) hypertension: Secondary | ICD-10-CM | POA: Diagnosis not present

## 2023-06-04 DIAGNOSIS — R7303 Prediabetes: Secondary | ICD-10-CM

## 2023-06-04 DIAGNOSIS — Z125 Encounter for screening for malignant neoplasm of prostate: Secondary | ICD-10-CM

## 2023-06-04 DIAGNOSIS — N529 Male erectile dysfunction, unspecified: Secondary | ICD-10-CM | POA: Diagnosis not present

## 2023-06-04 LAB — COMPREHENSIVE METABOLIC PANEL
ALT: 29 U/L (ref 0–53)
AST: 19 U/L (ref 0–37)
Albumin: 4.2 g/dL (ref 3.5–5.2)
Alkaline Phosphatase: 78 U/L (ref 39–117)
BUN: 11 mg/dL (ref 6–23)
CO2: 31 meq/L (ref 19–32)
Calcium: 9.3 mg/dL (ref 8.4–10.5)
Chloride: 102 meq/L (ref 96–112)
Creatinine, Ser: 1.09 mg/dL (ref 0.40–1.50)
GFR: 72.11 mL/min (ref >60.00)
Glucose, Bld: 106 mg/dL — ABNORMAL HIGH (ref 70–99)
Potassium: 4.3 meq/L (ref 3.5–5.1)
Sodium: 140 meq/L (ref 135–145)
Total Bilirubin: 1 mg/dL (ref 0.2–1.2)
Total Protein: 6.9 g/dL (ref 6.0–8.3)

## 2023-06-04 LAB — CBC
HCT: 47.3 % (ref 39.0–52.0)
Hemoglobin: 15.6 g/dL (ref 13.0–17.0)
MCHC: 32.9 g/dL (ref 30.0–36.0)
MCV: 96.9 fL (ref 78.0–100.0)
Platelets: 291 10*3/uL (ref 150.0–400.0)
RBC: 4.88 Mil/uL (ref 4.22–5.81)
RDW: 12.9 % (ref 11.5–15.5)
WBC: 8 10*3/uL (ref 4.0–10.5)

## 2023-06-04 LAB — LIPID PANEL
Cholesterol: 132 mg/dL (ref 0–200)
HDL: 38.2 mg/dL — ABNORMAL LOW (ref >39.00)
LDL Cholesterol: 68 mg/dL (ref 0–99)
NonHDL: 93.8
Total CHOL/HDL Ratio: 3
Triglycerides: 129 mg/dL (ref 0.0–149.0)
VLDL: 25.8 mg/dL (ref 0.0–40.0)

## 2023-06-04 LAB — PSA: PSA: 1.15 ng/mL (ref 0.10–4.00)

## 2023-06-04 MED ORDER — BENZONATATE 200 MG PO CAPS: 200.0000 mg | ORAL_CAPSULE | Freq: Three times a day (TID) | ORAL | 0 refills | Status: DC | PRN

## 2023-06-04 MED ORDER — TADALAFIL 10 MG PO TABS: ORAL_TABLET | ORAL | 0 refills | Status: DC

## 2023-06-04 MED ORDER — AMOXICILLIN-POT CLAVULANATE 875-125 MG PO TABS: 1.0000 | ORAL_TABLET | Freq: Two times a day (BID) | ORAL | 0 refills | Status: DC

## 2023-06-04 NOTE — Progress Notes (Signed)
Subjective:    Patient ID: Matthew Horne, male    DOB: Dec 12, 1960, 62 y.o.   MRN: [id number]  HPI  Matthew Horne is a very pleasant 62 y.o. male who presents today for complete physical and follow up of chronic conditions.  He would also like to discuss acute cough. Symptoms onset one week ago with scratchy throat, post nasal drip. He then developed a cough and chest congestion. He denies fevers, esophageal burning. His cough is the most bothersome symptoms now which is worse in the morning and at night.   He's been taking Robitussin, Delsym, and Nyquil without much improvement. He's also been drinking hot tea which helps to soothe his throat. He was exposed to his grandchildren which had the same symptoms.   Immunizations: -Tetanus: Completed in 2023 -Influenza: Influenza vaccine provided today.  -Shingles: Completed Shingrix series  Diet: Fair diet.  Exercise: No regular exercise. Active and walking.  Eye exam: Completed several years ago  Dental exam: Completes semi-annually    Colonoscopy: Completed in 2023, due 2026  PSA: Due  BP Readings from Last 3 Encounters:  06/04/23 124/72  05/30/22 120/78  09/08/21 106/61         Review of Systems  Constitutional:  Negative for unexpected weight change.  HENT:  Positive for congestion and postnasal drip. Negative for rhinorrhea.   Respiratory:  Positive for cough. Negative for shortness of breath.   Cardiovascular:  Negative for chest pain.  Gastrointestinal:  Negative for constipation and diarrhea.  Genitourinary:  Negative for difficulty urinating.  Musculoskeletal:  Negative for arthralgias and myalgias.  Skin:  Negative for rash.  Allergic/Immunologic: Negative for environmental allergies.  Neurological:  Negative for dizziness, numbness and headaches.  Psychiatric/Behavioral:  The patient is not nervous/anxious.          Past Medical History:  Diagnosis Date   Allergy    Contact lens/glasses fitting     wears contacts or glasses   DDD (degenerative disc disease)    Hyperlipidemia    Hypertension     Social History   Socioeconomic History   Marital status: Married    Spouse name: Not on file   Number of children: Not on file   Years of education: Not on file   Highest education level: Not on file  Occupational History   Not on file  Tobacco Use   Smoking status: Light Smoker    Types: Cigars   Smokeless tobacco: Never   Tobacco comments:    Cigar once in a while  Vaping Use   Vaping status: Never Used  Substance and Sexual Activity   Alcohol use: Yes    Alcohol/week: 2.0 - 3.0 standard drinks of alcohol    Types: 2 - 3 Standard drinks or equivalent per week    Comment: social   Drug use: No   Sexual activity: Not on file    Comment: occ cigar  Other Topics Concern   Not on file  Social History Narrative   ** Merged History Encounter **       Married.  2 children. 3 grandchildren.  Works as a Pharmacist, hospital. Enjoys spending time outdoors, fishing, farming.    Social Determinants of Health   Financial Resource Strain: Not on file  Food Insecurity: Not on file  Transportation Needs: Not on file  Physical Activity: Not on file  Stress: Not on file  Social Connections: Not on file  Intimate Partner Violence: Not on file  Past Surgical History:  Procedure Laterality Date   BACK SURGERY  2010, 2003   lumb lam/diskectomy   COLONOSCOPY     KNEE ARTHROSCOPY Left 02/03/2013   Procedure: LEFT KNEE ARTHROSCOPY, PARTIAL MEDIAL MENISCECTOMY, CHONDROPLASTY;  Surgeon: Velna Ochs, MD;  Location: [facility];  Service: Orthopedics;  Laterality: Left;   POLYPECTOMY     TONSILLECTOMY  07/30/1965    Family History  Problem Relation Age of Onset   Hyperlipidemia Mother    Hypertension Mother    Hyperlipidemia Father    Hypertension Father    Breast cancer Paternal Grandmother    Colon cancer Neg Hx    Esophageal cancer Neg Hx    Rectal  cancer Neg Hx    Stomach cancer Neg Hx    Liver cancer Neg Hx    Pancreatic cancer Neg Hx    Colon polyps Neg Hx     No Known Allergies  Current Outpatient Medications on File Prior to Visit  Medication Sig Dispense Refill   aspirin 81 MG tablet Take by mouth.     atorvastatin (LIPITOR) 20 MG tablet Take 1 tablet (20 mg total) by mouth daily. for cholesterol. 90 tablet 2   cholecalciferol (VITAMIN D) 1000 UNITS tablet Take 1,000 Units by mouth daily.      lisinopril (ZESTRIL) 10 MG tablet Take 1 tablet (10 mg total) by mouth daily. for blood pressure. 90 tablet 2   Multiple Vitamins-Minerals (MULTIVITAMIN ADULTS 50+ PO) Take by mouth daily.     No current facility-administered medications on file prior to visit.    BP 124/72   Pulse 62   Temp (!) 97.5 F (36.4 C) (Temporal)   Ht 5' 10.87" (1.8 m)   Wt 230 lb 3.2 oz (104.4 kg)   SpO2 93%   BMI 32.23 kg/m  Objective:   Physical Exam Constitutional:      Appearance: He is ill-appearing.  HENT:     Right Ear: Tympanic membrane and ear canal normal.     Left Ear: Tympanic membrane and ear canal normal.  Eyes:     Pupils: Pupils are equal, round, and reactive to light.  Cardiovascular:     Rate and Rhythm: Normal rate and regular rhythm.  Pulmonary:     Effort: Pulmonary effort is normal.     Breath sounds: Examination of the right-upper field reveals rhonchi. Examination of the right-lower field reveals rhonchi. Rhonchi present.     Comments: Congested cough noted throughout visit. Abdominal:     General: Bowel sounds are normal.     Palpations: Abdomen is soft.     Tenderness: There is no abdominal tenderness.  Musculoskeletal:        General: Normal range of motion.     Cervical back: Neck supple.  Skin:    General: Skin is warm and dry.  Neurological:     Mental Status: He is alert and oriented to person, place, and time.     Cranial Nerves: No cranial nerve deficit.     Deep Tendon Reflexes:     Reflex  Scores:      Patellar reflexes are 2+ on the right side and 2+ on the left side. Psychiatric:        Mood and Affect: Mood normal.           Assessment & Plan:  Encounter for annual general medical examination with abnormal findings in adult Assessment & Plan: Immunizations UTD.  Colonoscopy UTD, due 2026 PSA due  and pending.  Discussed the importance of a healthy diet and regular exercise in order for weight loss, and to reduce the risk of further co-morbidity.  Exam stable. Labs pending.  Follow up in 1 year for repeat physical.    Need for influenza vaccination -     Flu vaccine trivalent PF, 6mos and older(Flulaval,Afluria,Fluarix,Fluzone)  Primary hypertension Assessment & Plan: Controlled.  Continue lisinopril 10 mg daily. CMP pending.  Orders: -     Comprehensive metabolic panel -     CBC  Erectile dysfunction, unspecified erectile dysfunction type Assessment & Plan: Improved, but not quite at goal.  Increase Cialis to 10 mg PRN. Continue to monitor.   Orders: -     Tadalafil; Take 1 tablet by mouth 30-60 minutes prior to sexual activity.  Dispense: 30 tablet; Refill: 0  Hyperlipidemia with target LDL less than 100 Assessment & Plan: Repeat lipid panel pending. Continue atorvastatin 20 mg daily.  Orders: -     Lipid panel  Prediabetes Assessment & Plan: Repeat A1c pending.   Acute cough Assessment & Plan: Exam today with questionable lung sounds. Given duration of symptoms, coupled with exam findings, we will treat for presumed bacterial involvement.  Start Augmentin antibiotics. Take 1 tablet by mouth twice daily for 7 days. Start Benzonatate capsules for cough. Take 1 capsule by mouth three times daily as needed for cough.  Follow up PRN  Orders: -     Amoxicillin-Pot Clavulanate; Take 1 tablet by mouth 2 (two) times daily.  Dispense: 14 tablet; Refill: 0 -     Benzonatate; Take 1 capsule (200 mg total) by mouth 3 (three) times  daily as needed for cough.  Dispense: 15 capsule; Refill: 0  Screening for prostate cancer -     PSA        Doreene Nest, NP

## 2023-06-04 NOTE — Assessment & Plan Note (Signed)
Controlled.   Continue lisinopril 10 mg daily.  CMP pending.  

## 2023-06-04 NOTE — Assessment & Plan Note (Signed)
Exam today with questionable lung sounds. Given duration of symptoms, coupled with exam findings, we will treat for presumed bacterial involvement.  Start Augmentin antibiotics. Take 1 tablet by mouth twice daily for 7 days. Start Benzonatate capsules for cough. Take 1 capsule by mouth three times daily as needed for cough.  Follow up PRN

## 2023-06-04 NOTE — Patient Instructions (Signed)
Stop by the lab prior to leaving today. I will notify you of your results once received.   Start Augmentin antibiotics. Take 1 tablet by mouth twice daily for 7 days.  You may take Benzonatate capsules for cough. Take 1 capsule by mouth three times daily as needed for cough.  We increased the dose of your tadalafil (Cialis) to 10 mg as needed.  It was a pleasure to see you today!

## 2023-06-04 NOTE — Assessment & Plan Note (Signed)
Improved, but not quite at goal.  Increase Cialis to 10 mg PRN. Continue to monitor.

## 2023-06-04 NOTE — Assessment & Plan Note (Signed)
Immunizations UTD. Colonoscopy UTD, due 2026 PSA due and pending.  Discussed the importance of a healthy diet and regular exercise in order for weight loss, and to reduce the risk of further co-morbidity.  Exam stable. Labs pending.  Follow up in 1 year for repeat physical.  

## 2023-06-04 NOTE — Assessment & Plan Note (Signed)
Repeat A1c pending. 

## 2023-06-04 NOTE — Assessment & Plan Note (Signed)
 Repeat lipid panel pending.  Continue atorvastatin 20 mg daily. 

## 2023-06-07 ENCOUNTER — Other Ambulatory Visit: Payer: Self-pay | Admitting: Primary Care

## 2023-06-07 DIAGNOSIS — E785 Hyperlipidemia, unspecified: Secondary | ICD-10-CM

## 2023-06-07 DIAGNOSIS — I10 Essential (primary) hypertension: Secondary | ICD-10-CM

## 2023-06-12 ENCOUNTER — Other Ambulatory Visit: Payer: Self-pay | Admitting: Primary Care

## 2023-06-12 DIAGNOSIS — N529 Male erectile dysfunction, unspecified: Secondary | ICD-10-CM

## 2023-06-12 MED ORDER — TADALAFIL 10 MG PO TABS: ORAL_TABLET | ORAL | 0 refills | Status: DC

## 2024-04-27 ENCOUNTER — Ambulatory Visit: Payer: Self-pay

## 2024-04-27 NOTE — Telephone Encounter (Signed)
Will see then. 

## 2024-04-27 NOTE — Telephone Encounter (Signed)
 FYI Only or Action Required?: FYI only for provider.  Patient was last seen in primary care on 06/04/2023 by Gretta Comer POUR, NP.  Called Nurse Triage reporting Abdominal Pain.  Symptoms began about a month ago.  Symptoms are: unchanged.  Triage Disposition: See PCP Within 2 Weeks  Patient/caregiver understands and will follow disposition?: Yes      Copied from CRM [id number]. Topic: Clinical - Red Word Triage >> Apr 27, 2024  2:26 PM Timindy P wrote: Red Word that prompted transfer to Nurse Triage: Abdominal pain and swelling, unable to eat like normal, Patient stating he was cutting wood and thinks he may have a hernia, belly button is sticking out as well.      Reason for Disposition  Abdominal pain is a chronic symptom (recurrent or ongoing AND present > 4 weeks)    No chronic but present for a month, very minor discomfort only reported  Answer Assessment - Initial Assessment Questions 1. LOCATION: Where does it hurt?      Upper abdomen  2. RADIATION: Does the pain shoot anywhere else? (e.g., chest, back)     No 3. ONSET: When did the pain begin? (Minutes, hours or days ago)      About a month ago  4. SUDDEN: Gradual or sudden onset?     Sudden  5. PATTERN Does the pain come and go, or is it constant?     Constant  6. SEVERITY: How bad is the pain?  (e.g., Scale 1-10; mild, moderate, or severe)     Mild discomfort feels like indigestion  7. RECURRENT SYMPTOM: Have you ever had this type of stomach pain before? If Yes, ask: When was the last time? and What happened that time?      No 8. CAUSE: What do you think is causing the stomach pain? (e.g., gallstones, recent abdominal surgery)     Concerned about hernia after lifting  9. RELIEVING/AGGRAVATING FACTORS: What makes it better or worse? (e.g., antacids, bending or twisting motion, bowel movement)     No 10. OTHER SYMPTOMS: Do you have any other symptoms? (e.g., back pain, diarrhea, fever,  urination pain, vomiting)       Believes belly button may be sticking out more than in the past  Protocols used: Abdominal Pain - Male-A-AH

## 2024-04-29 ENCOUNTER — Encounter: Payer: Self-pay | Admitting: Family Medicine

## 2024-04-29 ENCOUNTER — Ambulatory Visit: Admitting: Family Medicine

## 2024-04-29 VITALS — BP 120/78 | HR 62 | Temp 98.6°F | Ht 70.8 in | Wt 230.0 lb

## 2024-04-29 DIAGNOSIS — R1032 Left lower quadrant pain: Secondary | ICD-10-CM | POA: Diagnosis not present

## 2024-04-29 DIAGNOSIS — I10 Essential (primary) hypertension: Secondary | ICD-10-CM | POA: Diagnosis not present

## 2024-04-29 DIAGNOSIS — E66811 Obesity, class 1: Secondary | ICD-10-CM | POA: Diagnosis not present

## 2024-04-29 DIAGNOSIS — K429 Umbilical hernia without obstruction or gangrene: Secondary | ICD-10-CM | POA: Insufficient documentation

## 2024-04-29 MED ORDER — LISINOPRIL 10 MG PO TABS: 10.0000 mg | ORAL_TABLET | Freq: Every day | ORAL | 0 refills | Status: DC

## 2024-04-29 NOTE — Progress Notes (Signed)
 Ph: [phone] Fax: [phone]   Patient ID: Matthew Horne, male    DOB: April 27, 1961, 63 y.o.   MRN: [id number]  This visit was conducted in person.  BP 120/78   Pulse 62   Temp 98.6 F (37 C) (Oral)   Ht 5' 10.8 (1.798 m)   Wt 230 lb (104.3 kg)   SpO2 98%   BMI 32.26 kg/m    CC: abdominal pain to LLQ Subjective:   HPI: Matthew Horne is a 63 y.o. male presenting on 04/29/2024 for Abdominal Pain (Started after doing some heavy lifting about 3 weeks ago. Has gotten little better over time but still having LLQ pain )   Has been lifting firewood  - 3 wks ago felt left lower abd and umbilical straining associated with some indigestion managed with tums with benefit Currently tender to let lower abdomen just medial to belly button.  Normal BM. No constipation, no straining. No bulge to groin.  Hasn't tried anything for this in the past. No h/o hernia, no h/o abd surgeries.  No fevers/chills, nausea/vomiting, BM, diarrhea or constipation.        Relevant past medical, surgical, family and social history reviewed and updated as indicated. Interim medical history since our last visit reviewed. Allergies and medications reviewed and updated. Outpatient Medications Prior to Visit  Medication Sig Dispense Refill   amoxicillin -clavulanate (AUGMENTIN ) 875-125 MG tablet Take 1 tablet by mouth 2 (two) times daily. 14 tablet 0   aspirin 81 MG tablet Take by mouth.     atorvastatin  (LIPITOR) 20 MG tablet TAKE 1 TABLET DAILY FOR CHOLESTEROL 90 tablet 3   benzonatate  (TESSALON ) 200 MG capsule Take 1 capsule (200 mg total) by mouth 3 (three) times daily as needed for cough. 15 capsule 0   cholecalciferol (VITAMIN D) 1000 UNITS tablet Take 1,000 Units by mouth daily.      Multiple Vitamins-Minerals (MULTIVITAMIN ADULTS 50+ PO) Take by mouth daily.     tadalafil  (CIALIS ) 10 MG tablet Take 1 tablet by mouth 30-60 minutes prior to sexual activity. 30 tablet 0   lisinopril  (ZESTRIL ) 10 MG  tablet TAKE 1 TABLET DAILY FOR BLOOD PRESSURE 90 tablet 3   No facility-administered medications prior to visit.     Per HPI unless specifically indicated in ROS section below Review of Systems  Objective:  BP 120/78   Pulse 62   Temp 98.6 F (37 C) (Oral)   Ht 5' 10.8 (1.798 m)   Wt 230 lb (104.3 kg)   SpO2 98%   BMI 32.26 kg/m   Wt Readings from Last 3 Encounters:  04/29/24 230 lb (104.3 kg)  06/04/23 230 lb 3.2 oz (104.4 kg)  05/30/22 221 lb (100.2 kg)      Physical Exam Vitals and nursing note reviewed.  Constitutional:      Appearance: Normal appearance. He is not ill-appearing.  Eyes:     Extraocular Movements: Extraocular movements intact.     Pupils: Pupils are equal, round, and reactive to light.  Abdominal:     General: Bowel sounds are normal. There is no distension.     Palpations: Abdomen is soft. There is no mass.     Tenderness: There is abdominal tenderness. There is no right CVA tenderness, left CVA tenderness, guarding or rebound.     Hernia: A hernia is present. Hernia is present in the umbilical area (small). There is no hernia in the left inguinal area, right femoral area, left femoral area or  right inguinal area.      Comments:  Mild weakening of abdominal wall, anticipate diastasis recti, to ventral abdomen Tender to palpation at umbilical hernia Tenderness to palpation left abdominal wall without obvious bulge No inguinal tenderness or bulge  Neurological:     Mental Status: He is alert.  Psychiatric:        Mood and Affect: Mood normal.        Behavior: Behavior normal.       Lab Results  Component Value Date   NA 140 06/04/2023   CL 102 06/04/2023   K 4.3 06/04/2023   CO2 31 06/04/2023   BUN 11 06/04/2023   CREATININE 1.09 06/04/2023   GFR 72.11 06/04/2023   CALCIUM  9.3 06/04/2023   ALBUMIN 4.2 06/04/2023   GLUCOSE 106 (H) 06/04/2023    Lab Results  Component Value Date   WBC 8.0 06/04/2023   HGB 15.6 06/04/2023   HCT 47.3  06/04/2023   MCV 96.9 06/04/2023   PLT 291.0 06/04/2023    Assessment & Plan:   Problem List Items Addressed This Visit     Hypertension   Requests lisinopril  refilled until he's able to schedule next CPE with PCP      Relevant Medications   lisinopril  (ZESTRIL ) 10 MG tablet   Umbilical hernia - Primary   Small, however given associated pain did recommend gen surg eval - referral placed. 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  Encouraged renewed efforts towards weight loss to prevent progressiona.       Relevant Orders   Ambulatory referral to General Surgery   Obesity, Class I, BMI 30-34.9   Encourage healthy diet and lifestyle choices to affect sustainable weight loss.       Abdominal wall pain in left lower quadrant   Suspect abdominal wall strain after heavy lifting 3 wks ago. Supportive measures reviewed - it is improving.  Anticipate diastasis recti present to ventral abdomen as well.         Meds ordered this encounter  Medications   lisinopril  (ZESTRIL ) 10 MG tablet    Sig: Take 1 tablet (10 mg total) by mouth daily. for blood pressure    Dispense:  90 tablet    Refill:  0    Orders Placed This Encounter  Procedures   Ambulatory referral to General Surgery    Referral Priority:   Routine    Referral Type:   Surgical    Referral Reason:   Specialty Services Required    Requested Specialty:   General Surgery    Number of Visits Requested:   1    Patient Instructions  You do have umbilical hernia, small.  Avoid straining, heavy lifting. Work on weight loss, diet.  I will refer you to general surgery in [facility] for evaluation   Follow up plan: No follow-ups on file.  Anton Blas, MD

## 2024-04-29 NOTE — Assessment & Plan Note (Addendum)
 Small, however given associated pain did recommend gen surg eval - referral placed. 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  Encouraged renewed efforts towards weight loss to prevent progressiona.

## 2024-04-29 NOTE — Assessment & Plan Note (Signed)
 Requests lisinopril  refilled until he's able to schedule next CPE with PCP

## 2024-04-29 NOTE — Assessment & Plan Note (Signed)
 Encourage healthy diet and lifestyle choices to affect sustainable weight loss

## 2024-04-29 NOTE — Patient Instructions (Addendum)
 You do have umbilical hernia, small.  Avoid straining, heavy lifting. Work on weight loss, diet.  I will refer you to general surgery in [facility] for evaluation

## 2024-04-29 NOTE — Assessment & Plan Note (Addendum)
 Suspect abdominal wall strain after heavy lifting 3 wks ago. Supportive measures reviewed - it is improving.  Anticipate diastasis recti present to ventral abdomen as well.

## 2024-04-29 NOTE — Telephone Encounter (Signed)
 Opened in error

## 2024-05-05 ENCOUNTER — Ambulatory Visit: Admitting: Family Medicine

## 2024-05-11 ENCOUNTER — Other Ambulatory Visit: Payer: Self-pay | Admitting: Primary Care

## 2024-05-11 DIAGNOSIS — E785 Hyperlipidemia, unspecified: Secondary | ICD-10-CM

## 2024-05-11 DIAGNOSIS — I10 Essential (primary) hypertension: Secondary | ICD-10-CM

## 2024-05-11 NOTE — Telephone Encounter (Signed)
 Please call patient:  Received refill request for atorvastatin  cholesterol pill and lisinopril  BP pill from [facility]. Dr. KANDICE sent a 90 day supply of lisinopril  on 04/29/24 to [facility]. Did he pick that up? Is he using [facility] now?

## 2024-05-12 NOTE — Telephone Encounter (Signed)
 Called and spoke with patient, he no longer needs either of these refilled. He picked up the lisinopril  that was sent in beginning of Oct. He has enough cholesterol med to last him until his upcoming visit. [facility] is still using [facility]. Only used [facility] for the one time prescription.

## 2024-05-12 NOTE — Telephone Encounter (Signed)
 Noted

## 2024-06-04 ENCOUNTER — Encounter: Admitting: Primary Care

## 2024-06-05 ENCOUNTER — Ambulatory Visit (INDEPENDENT_AMBULATORY_CARE_PROVIDER_SITE_OTHER): Admitting: Primary Care

## 2024-06-05 ENCOUNTER — Encounter: Payer: Self-pay | Admitting: Primary Care

## 2024-06-05 ENCOUNTER — Ambulatory Visit: Payer: Self-pay | Admitting: Primary Care

## 2024-06-05 VITALS — BP 134/84 | HR 60 | Ht 71.0 in | Wt 223.1 lb

## 2024-06-05 DIAGNOSIS — K429 Umbilical hernia without obstruction or gangrene: Secondary | ICD-10-CM

## 2024-06-05 DIAGNOSIS — E785 Hyperlipidemia, unspecified: Secondary | ICD-10-CM

## 2024-06-05 DIAGNOSIS — Z125 Encounter for screening for malignant neoplasm of prostate: Secondary | ICD-10-CM | POA: Diagnosis not present

## 2024-06-05 DIAGNOSIS — Z0000 Encounter for general adult medical examination without abnormal findings: Secondary | ICD-10-CM

## 2024-06-05 DIAGNOSIS — I10 Essential (primary) hypertension: Secondary | ICD-10-CM

## 2024-06-05 DIAGNOSIS — Z23 Encounter for immunization: Secondary | ICD-10-CM

## 2024-06-05 DIAGNOSIS — R7303 Prediabetes: Secondary | ICD-10-CM

## 2024-06-05 DIAGNOSIS — N529 Male erectile dysfunction, unspecified: Secondary | ICD-10-CM

## 2024-06-05 LAB — COMPREHENSIVE METABOLIC PANEL WITH GFR
ALT: 27 U/L (ref 0–53)
AST: 19 U/L (ref 0–37)
Albumin: 4.4 g/dL (ref 3.5–5.2)
Alkaline Phosphatase: 57 U/L (ref 39–117)
BUN: 14 mg/dL (ref 6–23)
CO2: 30 meq/L (ref 19–32)
Calcium: 9.3 mg/dL (ref 8.4–10.5)
Chloride: 103 meq/L (ref 96–112)
Creatinine, Ser: 1.07 mg/dL (ref 0.40–1.50)
GFR: 73.21 mL/min (ref >60.00)
Glucose, Bld: 95 mg/dL (ref 70–99)
Potassium: 4.4 meq/L (ref 3.5–5.1)
Sodium: 141 meq/L (ref 135–145)
Total Bilirubin: 2 mg/dL — ABNORMAL HIGH (ref 0.2–1.2)
Total Protein: 6.8 g/dL (ref 6.0–8.3)

## 2024-06-05 LAB — PSA: PSA: 0.92 ng/mL (ref 0.10–4.00)

## 2024-06-05 LAB — LIPID PANEL
Cholesterol: 108 mg/dL (ref 0–200)
HDL: 42.9 mg/dL (ref >39.00)
LDL Cholesterol: 50 mg/dL (ref 0–99)
NonHDL: 65.32
Total CHOL/HDL Ratio: 3
Triglycerides: 76 mg/dL (ref 0.0–149.0)
VLDL: 15.2 mg/dL (ref 0.0–40.0)

## 2024-06-05 LAB — HEMOGLOBIN A1C: Hgb A1c MFr Bld: 5.9 % (ref 4.6–6.5)

## 2024-06-05 NOTE — Assessment & Plan Note (Signed)
Stable.  Continue tadalafil 10 mg as needed. 

## 2024-06-05 NOTE — Assessment & Plan Note (Signed)
 Controlled.  Lisinopril  10 mg daily. CMP pending.

## 2024-06-05 NOTE — Assessment & Plan Note (Signed)
 Repeat A1c pending

## 2024-06-05 NOTE — Assessment & Plan Note (Signed)
 Immunizations UTD. Influenza vaccine provided today.  Colonoscopy UTD, due 2026 PSA due and pending.  Discussed the importance of a healthy diet and regular exercise in order for weight loss, and to reduce the risk of further co-morbidity.  Exam stable. Labs pending.  Follow up in 1 year for repeat physical.

## 2024-06-05 NOTE — Assessment & Plan Note (Signed)
 Repeat lipid panel pending. Continue atorvastatin 20 mg daily.

## 2024-06-05 NOTE — Patient Instructions (Signed)
 Stop by the lab prior to leaving today. I will notify you of your results once received.   It was a pleasure to see you today!

## 2024-06-05 NOTE — Assessment & Plan Note (Signed)
 Stable, pending surgery

## 2024-06-05 NOTE — Progress Notes (Signed)
 Subjective:    Patient ID: Matthew Horne, male    DOB: March 01, 1961, 63 y.o.   MRN: [id number]  Matthew Horne is a very pleasant 63 y.o. male who presents today for complete physical and follow up of chronic conditions.  Immunizations: -Tetanus: Completed in 2023 -Influenza: Influenza vaccine provided today.  -Shingles: Completed Shingrix series   Diet: Fair diet.  Exercise: No regular exercise.  Eye exam: Completed >1 year ago  Dental exam: Completes semi-annually    Colonoscopy: Completed in 2023, due 2026   PSA: Due   BP Readings from Last 3 Encounters:  06/05/24 134/84  04/29/24 120/78  06/04/23 124/72        Review of Systems  Constitutional:  Negative for unexpected weight change.  HENT:  Negative for rhinorrhea.   Respiratory:  Negative for cough and shortness of breath.   Cardiovascular:  Negative for chest pain.  Gastrointestinal:  Negative for constipation and diarrhea.  Genitourinary:  Negative for difficulty urinating.  Musculoskeletal:  Negative for arthralgias and myalgias.  Skin:  Negative for rash.  Allergic/Immunologic: Negative for environmental allergies.  Neurological:  Negative for dizziness and headaches.  Psychiatric/Behavioral:  The patient is not nervous/anxious.          Past Medical History:  Diagnosis Date   Allergy    Contact lens/glasses fitting    wears contacts or glasses   DDD (degenerative disc disease)    Hyperlipidemia    Hypertension     Social History   Socioeconomic History   Marital status: Married    Spouse name: Not on file   Number of children: Not on file   Years of education: Not on file   Highest education level: Not on file  Occupational History   Not on file  Tobacco Use   Smoking status: Light Smoker    Types: Cigars   Smokeless tobacco: Never   Tobacco comments:    Cigar once in a while  Vaping Use   Vaping status: Never Used  Substance and Sexual Activity   Alcohol use: Yes     Alcohol/week: 2.0 - 3.0 standard drinks of alcohol    Types: 2 - 3 Standard drinks or equivalent per week    Comment: social   Drug use: No   Sexual activity: Not on file    Comment: occ cigar  Other Topics Concern   Not on file  Social History Narrative   ** Merged History Encounter **       Married.  2 children. 3 grandchildren.  Works as a pharmacist, hospital. Enjoys spending time outdoors, fishing, farming.    Social Drivers of Corporate Investment Banker Strain: Not on file  Food Insecurity: Not on file  Transportation Needs: Not on file  Physical Activity: Not on file  Stress: Not on file  Social Connections: Not on file  Intimate Partner Violence: Not on file    Past Surgical History:  Procedure Laterality Date   BACK SURGERY  2010, 2003   lumb lam/diskectomy   COLONOSCOPY     KNEE ARTHROSCOPY Left 02/03/2013   Procedure: LEFT KNEE ARTHROSCOPY, PARTIAL MEDIAL MENISCECTOMY, CHONDROPLASTY;  Surgeon: Maude KANDICE Herald, MD;  Location: [facility];  Service: Orthopedics;  Laterality: Left;   POLYPECTOMY     TONSILLECTOMY  07/30/1965    Family History  Problem Relation Age of Onset   Hyperlipidemia Mother    Hypertension Mother    Hyperlipidemia Father    Hypertension Father  Breast cancer Paternal Grandmother    Colon cancer Neg Hx    Esophageal cancer Neg Hx    Rectal cancer Neg Hx    Stomach cancer Neg Hx    Liver cancer Neg Hx    Pancreatic cancer Neg Hx    Colon polyps Neg Hx     No Known Allergies  Current Outpatient Medications on File Prior to Visit  Medication Sig Dispense Refill   aspirin 81 MG tablet Take by mouth.     atorvastatin  (LIPITOR) 20 MG tablet TAKE 1 TABLET DAILY FOR CHOLESTEROL 90 tablet 3   lisinopril  (ZESTRIL ) 10 MG tablet Take 1 tablet (10 mg total) by mouth daily. for blood pressure 90 tablet 0   Multiple Vitamins-Minerals (MULTIVITAMIN ADULTS 50+ PO) Take by mouth daily.     tadalafil  (CIALIS ) 10 MG tablet Take  1 tablet by mouth 30-60 minutes prior to sexual activity. 30 tablet 0   No current facility-administered medications on file prior to visit.    BP 134/84   Pulse 60   Ht 5' 11 (1.803 m)   Wt 223 lb 2 oz (101.2 kg)   SpO2 94%   BMI 31.12 kg/m  Objective:   Physical Exam HENT:     Right Ear: Tympanic membrane and ear canal normal.     Left Ear: Tympanic membrane and ear canal normal.  Eyes:     Pupils: Pupils are equal, round, and reactive to light.  Cardiovascular:     Rate and Rhythm: Normal rate and regular rhythm.  Pulmonary:     Effort: Pulmonary effort is normal.     Breath sounds: Normal breath sounds.  Abdominal:     General: Bowel sounds are normal.     Palpations: Abdomen is soft.     Tenderness: There is no abdominal tenderness.  Musculoskeletal:        General: Normal range of motion.     Cervical back: Neck supple.  Skin:    General: Skin is warm and dry.  Neurological:     Mental Status: He is alert and oriented to person, place, and time.     Cranial Nerves: No cranial nerve deficit.     Deep Tendon Reflexes:     Reflex Scores:      Patellar reflexes are 2+ on the right side and 2+ on the left side. Psychiatric:        Mood and Affect: Mood normal.     Physical Exam        Assessment & Plan:  Preventative health care Assessment & Plan: Immunizations UTD. Influenza vaccine provided today.  Colonoscopy UTD, due 2026 PSA due and pending.  Discussed the importance of a healthy diet and regular exercise in order for weight loss, and to reduce the risk of further co-morbidity.  Exam stable. Labs pending.  Follow up in 1 year for repeat physical.    Need for influenza vaccination -     Flu vaccine trivalent PF, 6mos and older(Flulaval,Afluria,Fluarix,Fluzone)  Hyperlipidemia, unspecified hyperlipidemia type -     Lipid panel -     Comprehensive metabolic panel with GFR  Prediabetes Assessment & Plan: Repeat A1c pending.  Orders: -      Hemoglobin A1c  Screening for prostate cancer -     PSA  Primary hypertension Assessment & Plan: Controlled.  Lisinopril  10 mg daily. CMP pending.   Umbilical hernia without obstruction and without gangrene Assessment & Plan: Stable, pending surgery   Hyperlipidemia with target LDL  less than 100 Assessment & Plan: Repeat lipid panel pending. Continue atorvastatin  20 mg daily   Erectile dysfunction, unspecified erectile dysfunction type Assessment & Plan: Stable.  Continue tadalafil  10 mg as needed     Assessment and Plan Assessment & Plan         Comer MARLA Gaskins, NP     History of Present Illness

## 2024-06-15 DIAGNOSIS — E785 Hyperlipidemia, unspecified: Secondary | ICD-10-CM

## 2024-06-15 DIAGNOSIS — I10 Essential (primary) hypertension: Secondary | ICD-10-CM

## 2024-06-15 MED ORDER — LISINOPRIL 10 MG PO TABS
10.0000 mg | ORAL_TABLET | Freq: Every day | ORAL | 2 refills | Status: AC
  Filled 2024-08-31: qty 90, 90d supply, fill #0

## 2024-06-15 MED ORDER — ATORVASTATIN CALCIUM 20 MG PO TABS
20.0000 mg | ORAL_TABLET | Freq: Every day | ORAL | 2 refills | Status: AC
  Filled 2024-08-31: qty 90, 90d supply, fill #0

## 2024-07-27 DIAGNOSIS — N529 Male erectile dysfunction, unspecified: Secondary | ICD-10-CM

## 2024-07-28 MED ORDER — TADALAFIL 10 MG PO TABS: ORAL_TABLET | ORAL | 0 refills | Status: AC

## 2024-08-01 ENCOUNTER — Other Ambulatory Visit: Payer: Self-pay | Admitting: Family Medicine

## 2024-08-01 DIAGNOSIS — I10 Essential (primary) hypertension: Secondary | ICD-10-CM

## 2024-08-10 ENCOUNTER — Other Ambulatory Visit (HOSPITAL_COMMUNITY): Payer: Self-pay

## 2024-08-11 ENCOUNTER — Other Ambulatory Visit (HOSPITAL_COMMUNITY): Payer: Self-pay

## 2024-08-31 ENCOUNTER — Other Ambulatory Visit (HOSPITAL_COMMUNITY): Payer: Self-pay

## 2025-06-08 ENCOUNTER — Encounter: Admitting: Primary Care
